# Patient Record
Sex: Male | Born: 1945 | Race: White | Hispanic: No | Marital: Married | Smoking: Former smoker
Health system: Southern US, Community
[De-identification: ages and names within clinical notes are randomized; demographics above are authoritative.]

## PROBLEM LIST (undated history)

## (undated) DIAGNOSIS — C801 Malignant (primary) neoplasm, unspecified: Secondary | ICD-10-CM

## (undated) DIAGNOSIS — K219 Gastro-esophageal reflux disease without esophagitis: Secondary | ICD-10-CM

## (undated) DIAGNOSIS — M1990 Unspecified osteoarthritis, unspecified site: Secondary | ICD-10-CM

## (undated) DIAGNOSIS — E785 Hyperlipidemia, unspecified: Secondary | ICD-10-CM

## (undated) DIAGNOSIS — T7840XA Allergy, unspecified, initial encounter: Secondary | ICD-10-CM

## (undated) DIAGNOSIS — I739 Peripheral vascular disease, unspecified: Secondary | ICD-10-CM

## (undated) DIAGNOSIS — I10 Essential (primary) hypertension: Secondary | ICD-10-CM

## (undated) HISTORY — DX: Hyperlipidemia, unspecified: E78.5

## (undated) HISTORY — DX: Peripheral vascular disease, unspecified: I73.9

## (undated) HISTORY — DX: Essential (primary) hypertension: I10

## (undated) HISTORY — DX: Allergy, unspecified, initial encounter: T78.40XA

---

## 1998-11-08 HISTORY — PX: HYDROCELE EXCISION: SHX482

## 2005-09-15 ENCOUNTER — Ambulatory Visit: Payer: Self-pay | Admitting: Gastroenterology

## 2008-11-13 ENCOUNTER — Ambulatory Visit: Payer: Self-pay | Admitting: Gastroenterology

## 2009-06-30 DIAGNOSIS — Z86018 Personal history of other benign neoplasm: Secondary | ICD-10-CM

## 2009-06-30 HISTORY — DX: Personal history of other benign neoplasm: Z86.018

## 2012-04-05 ENCOUNTER — Ambulatory Visit: Payer: Self-pay | Admitting: Gastroenterology

## 2013-01-09 ENCOUNTER — Ambulatory Visit: Payer: Self-pay | Admitting: Internal Medicine

## 2013-01-23 DIAGNOSIS — R310 Gross hematuria: Secondary | ICD-10-CM | POA: Insufficient documentation

## 2013-02-28 ENCOUNTER — Ambulatory Visit: Payer: Self-pay | Admitting: Unknown Physician Specialty

## 2013-03-06 HISTORY — PX: JOINT REPLACEMENT: SHX530

## 2014-03-10 DIAGNOSIS — Z96652 Presence of left artificial knee joint: Secondary | ICD-10-CM | POA: Insufficient documentation

## 2014-12-03 ENCOUNTER — Ambulatory Visit
Admission: RE | Admit: 2014-12-03 | Discharge: 2014-12-03 | Disposition: A | Discharge status: Home / Self Care | Payer: Medicare PPO | Source: Ambulatory Visit | Attending: Internal Medicine | Admitting: Internal Medicine

## 2014-12-03 ENCOUNTER — Other Ambulatory Visit: Payer: Self-pay | Admitting: Internal Medicine

## 2014-12-03 DIAGNOSIS — R600 Localized edema: Secondary | ICD-10-CM

## 2014-12-03 DIAGNOSIS — R590 Localized enlarged lymph nodes: Secondary | ICD-10-CM | POA: Insufficient documentation

## 2014-12-03 DIAGNOSIS — M7122 Synovial cyst of popliteal space [Baker], left knee: Secondary | ICD-10-CM | POA: Insufficient documentation

## 2015-01-14 DIAGNOSIS — M1711 Unilateral primary osteoarthritis, right knee: Secondary | ICD-10-CM | POA: Insufficient documentation

## 2015-02-04 DIAGNOSIS — C84A0 Cutaneous T-cell lymphoma, unspecified, unspecified site: Secondary | ICD-10-CM | POA: Insufficient documentation

## 2015-07-13 ENCOUNTER — Other Ambulatory Visit: Payer: Self-pay | Admitting: Unknown Physician Specialty

## 2015-07-13 DIAGNOSIS — Z96652 Presence of left artificial knee joint: Secondary | ICD-10-CM

## 2015-07-27 DIAGNOSIS — E790 Hyperuricemia without signs of inflammatory arthritis and tophaceous disease: Secondary | ICD-10-CM | POA: Insufficient documentation

## 2015-07-31 ENCOUNTER — Ambulatory Visit
Admission: RE | Admit: 2015-07-31 | Discharge: 2015-07-31 | Disposition: A | Discharge status: Home / Self Care | Payer: Medicare Other | Source: Ambulatory Visit | Attending: Unknown Physician Specialty | Admitting: Unknown Physician Specialty

## 2015-07-31 DIAGNOSIS — Z96659 Presence of unspecified artificial knee joint: Secondary | ICD-10-CM | POA: Diagnosis present

## 2015-07-31 DIAGNOSIS — M7122 Synovial cyst of popliteal space [Baker], left knee: Secondary | ICD-10-CM | POA: Diagnosis present

## 2015-07-31 DIAGNOSIS — Z96652 Presence of left artificial knee joint: Secondary | ICD-10-CM

## 2015-07-31 DIAGNOSIS — X58XXXA Exposure to other specified factors, initial encounter: Secondary | ICD-10-CM | POA: Diagnosis not present

## 2015-07-31 DIAGNOSIS — S83282A Other tear of lateral meniscus, current injury, left knee, initial encounter: Secondary | ICD-10-CM | POA: Diagnosis not present

## 2016-03-02 HISTORY — PX: JOINT REPLACEMENT: SHX530

## 2016-03-15 ENCOUNTER — Encounter (INDEPENDENT_AMBULATORY_CARE_PROVIDER_SITE_OTHER): Payer: Self-pay

## 2016-03-15 ENCOUNTER — Ambulatory Visit (INDEPENDENT_AMBULATORY_CARE_PROVIDER_SITE_OTHER): Payer: Self-pay | Admitting: Vascular Surgery

## 2016-04-18 ENCOUNTER — Other Ambulatory Visit (INDEPENDENT_AMBULATORY_CARE_PROVIDER_SITE_OTHER): Payer: Self-pay | Admitting: Vascular Surgery

## 2016-04-18 DIAGNOSIS — I6523 Occlusion and stenosis of bilateral carotid arteries: Secondary | ICD-10-CM

## 2016-04-19 ENCOUNTER — Ambulatory Visit (INDEPENDENT_AMBULATORY_CARE_PROVIDER_SITE_OTHER): Payer: Self-pay | Admitting: Vascular Surgery

## 2016-04-19 ENCOUNTER — Encounter (INDEPENDENT_AMBULATORY_CARE_PROVIDER_SITE_OTHER): Payer: Medicare Other

## 2016-06-07 ENCOUNTER — Ambulatory Visit (INDEPENDENT_AMBULATORY_CARE_PROVIDER_SITE_OTHER): Payer: Medicare Other | Admitting: Vascular Surgery

## 2016-06-07 ENCOUNTER — Ambulatory Visit (INDEPENDENT_AMBULATORY_CARE_PROVIDER_SITE_OTHER): Payer: Medicare Other

## 2016-06-07 ENCOUNTER — Encounter (INDEPENDENT_AMBULATORY_CARE_PROVIDER_SITE_OTHER): Payer: Self-pay | Admitting: Vascular Surgery

## 2016-06-07 VITALS — BP 162/96 | HR 82 | Resp 16 | Ht 70.0 in | Wt 262.0 lb

## 2016-06-07 DIAGNOSIS — I6523 Occlusion and stenosis of bilateral carotid arteries: Secondary | ICD-10-CM

## 2016-06-07 DIAGNOSIS — I6521 Occlusion and stenosis of right carotid artery: Secondary | ICD-10-CM

## 2016-06-07 DIAGNOSIS — E785 Hyperlipidemia, unspecified: Secondary | ICD-10-CM

## 2016-06-07 DIAGNOSIS — I10 Essential (primary) hypertension: Secondary | ICD-10-CM | POA: Diagnosis not present

## 2016-06-07 DIAGNOSIS — I6529 Occlusion and stenosis of unspecified carotid artery: Secondary | ICD-10-CM | POA: Insufficient documentation

## 2016-06-07 LAB — VAS US CAROTID
LEFT ECA DIAS: -13 cm/s
LICADDIAS: -15 cm/s
LICAPDIAS: 8 cm/s
Left CCA dist dias: -15 cm/s
Left CCA dist sys: -70 cm/s
Left CCA prox dias: 11 cm/s
Left CCA prox sys: 91 cm/s
Left ICA dist sys: -57 cm/s
Left ICA prox sys: 47 cm/s
RCCAPDIAS: 16 cm/s
RIGHT CCA MID DIAS: 12 cm/s
RIGHT ECA DIAS: -12 cm/s
Right CCA prox sys: 88 cm/s
Right cca dist sys: -67 cm/s

## 2016-06-07 NOTE — Assessment & Plan Note (Signed)
His carotid duplex today reveals stable 1-39% right ICA stenosis and no significant left carotid artery stenosis is present. Continue current medical regimen. Recheck in 1-2 years.

## 2016-06-07 NOTE — Assessment & Plan Note (Signed)
lipid control important in reducing the progression of atherosclerotic disease. Continue statin therapy  

## 2016-06-07 NOTE — Progress Notes (Signed)
MRN : [id number]  John Peterson is a 71 y.o. (1945-11-27) male who presents with chief complaint of  Chief Complaint  Patient presents with  . Carotid    1 year carotid  .  History of Present Illness: Patient returns in follow-up of his carotid disease. He continues to take a statin agent and is also on aspirin. He reports his only major issue since his last visit have been orthopedic issues. He is recovering from a left knee replacement and needs a right knee replacement. He denies focal neurologic symptoms. Specifically, the patient denies amaurosis fugax, speech or swallowing difficulties, or arm or leg weakness or numbness His carotid duplex today reveals stable 1-39% right ICA stenosis and no significant left carotid artery stenosis is present.  Current Outpatient Prescriptions  Medication Sig Dispense Refill  . allopurinol (ZYLOPRIM) 100 MG tablet Take 100 mg by mouth daily.  3  . amLODipine (NORVASC) 10 MG tablet TAKE 1 TABLET BY MOUTH ONCE A DAY    . aspirin EC 81 MG tablet Take by mouth.    Marland Kitchen atorvastatin (LIPITOR) 10 MG tablet Take by mouth.    . celecoxib (CELEBREX) 200 MG capsule TAKE 1 CAPSULE (200 MG TOTAL) BY MOUTH ONCE DAILY.  2  . desonide (DESOWEN) 0.05 % cream APPLY ON THE SKIN DAILY AS NEEDED FLARES  12  . ELIQUIS 2.5 MG TABS tablet TAKE 1 TABLET BY MOUTH TWICE A DAY FOR 2 WEEKS  0  . famotidine (PEPCID) 20 MG tablet Take by mouth.    Marland Kitchen lisinopril (PRINIVIL,ZESTRIL) 40 MG tablet Take 40 mg by mouth daily.  1  . Loratadine 10 MG CAPS Take by mouth.    . Multiple Vitamins-Minerals (MULTIVITAMIN WITH MINERALS) tablet Take by mouth.    . oxyCODONE (OXY IR/ROXICODONE) 5 MG immediate release tablet TAKE 1 TABLET BY MOUTH EVERY 4 TO 6 HOURS AS NEEDED FOR PAIN  0  . rivaroxaban (XARELTO) 10 MG TABS tablet     . traMADol (ULTRAM) 50 MG tablet Take by mouth.     No current facility-administered medications for this visit.     Past Medical History:  Diagnosis Date  .  Allergy   . Hyperlipidemia   . Hypertension   . Peripheral vascular disease ([place])     No past surgical history on file.  Social History Social History  Substance Use Topics  . Smoking status: Former Smoker    Quit date: 1968  . Smokeless tobacco: Never Used  . Alcohol use Yes     Family History No bleeding or clotting disorders  Allergies  Allergen Reactions  . Tramadol Swelling    Pt concerned if this med making his glands in neck swell. But has taken since then with no adverse reactions.     REVIEW OF SYSTEMS (Negative unless checked)  Constitutional: [] Weight loss  [] Fever  [] Chills Cardiac: [] Chest pain   [] Chest pressure   [] Palpitations   [] Shortness of breath when laying flat   [] Shortness of breath at rest   [] Shortness of breath with exertion. Vascular:  [] Pain in legs with walking   [] Pain in legs at rest   [] Pain in legs when laying flat   [] Claudication   [] Pain in feet when walking  [] Pain in feet at rest  [] Pain in feet when laying flat   [] History of DVT   [] Phlebitis   [x] Swelling in legs   [] Varicose veins   [] Non-healing ulcers Pulmonary:   [] Uses home oxygen   []   Productive cough   [] Hemoptysis   [] Wheeze  [] COPD   [] Asthma Neurologic:  [] Dizziness  [] Blackouts   [] Seizures   [] History of stroke   [] History of TIA  [] Aphasia   [] Temporary blindness   [] Dysphagia   [] Weakness or numbness in arms   [] Weakness or numbness in legs Musculoskeletal:  [x] Arthritis   [] Joint swelling   [x] Joint pain   [] Low back pain Hematologic:  [] Easy bruising  [] Easy bleeding   [] Hypercoagulable state   [] Anemic  [] Hepatitis Gastrointestinal:  [] Blood in stool   [] Vomiting blood  [] Gastroesophageal reflux/heartburn   [] Difficulty swallowing. Genitourinary:  [] Chronic kidney disease   [] Difficult urination  [] Frequent urination  [] Burning with urination   [] Blood in urine Skin:  [] Rashes   [] Ulcers   [] Wounds Psychological:  [] History of anxiety   []  History of major  depression.  Physical Examination  Vitals:   06/07/16 0827 06/07/16 0828  BP: (!) 175/96 (!) 162/96  Pulse: 82   Resp: 16   Weight: 118.8 kg (262 lb)   Height: 5\' 10"  (1.778 m)    Body mass index is 37.59 kg/m. Gen:  WD/WN, NAD. Appears younger than stated age Head: [place]/AT, No temporalis wasting. Ear/Nose/Throat: Hearing grossly intact, nares w/o erythema or drainage, trachea midline Eyes: Conjunctiva clear. Sclera non-icteric Neck: Supple.  No bruit or JVD.  Pulmonary:  Good air movement, equal and clear to auscultation bilaterally.  Cardiac: RRR, normal S1, S2, no Murmurs, rubs or gallops. Vascular:  Vessel Right Left  Radial Palpable Palpable                                   Gastrointestinal: soft, non-tender/non-distended.  Musculoskeletal: M/S 5/5 throughout.  No deformity or atrophy.  Neurologic: CN 2-12 intact. Sensation grossly intact in extremities.  Symmetrical.  Speech is fluent. Motor exam as listed above. Psychiatric: Judgment intact, Mood & affect appropriate for pt's clinical situation. Dermatologic: No rashes or ulcers noted.  No cellulitis or open wounds.     CBC No results found for: WBC, HGB, HCT, MCV, PLT  BMET No results found for: NA, K, CL, CO2, GLUCOSE, BUN, CREATININE, CALCIUM, GFRNONAA, GFRAA CrCl cannot be calculated (No order found.).  COAG No results found for: INR, PROTIME  Radiology No results found.   Assessment/Plan Essential hypertension, benign blood pressure control important in reducing the progression of atherosclerotic disease. On appropriate oral medications.   Hyperlipidemia lipid control important in reducing the progression of atherosclerotic disease. Continue statin therapy   Carotid stenosis His carotid duplex today reveals stable 1-39% right ICA stenosis and no significant left carotid artery stenosis is present. Continue current medical regimen. Recheck in 1-2 years.    Leotis Pain, MD  06/07/2016 9:02  AM    This note was created with Dragon medical transcription system.  Any errors from dictation are purely unintentional

## 2016-06-07 NOTE — Assessment & Plan Note (Signed)
blood pressure control important in reducing the progression of atherosclerotic disease. On appropriate oral medications.  

## 2016-11-22 DIAGNOSIS — E669 Obesity, unspecified: Secondary | ICD-10-CM | POA: Insufficient documentation

## 2017-06-09 ENCOUNTER — Ambulatory Visit (INDEPENDENT_AMBULATORY_CARE_PROVIDER_SITE_OTHER): Payer: Medicare Other

## 2017-06-09 ENCOUNTER — Encounter (INDEPENDENT_AMBULATORY_CARE_PROVIDER_SITE_OTHER): Payer: Self-pay | Admitting: Vascular Surgery

## 2017-06-09 ENCOUNTER — Ambulatory Visit (INDEPENDENT_AMBULATORY_CARE_PROVIDER_SITE_OTHER): Payer: Medicare Other | Admitting: Vascular Surgery

## 2017-06-09 VITALS — BP 146/90 | HR 90 | Resp 17 | Ht 69.0 in | Wt 257.0 lb

## 2017-06-09 DIAGNOSIS — E785 Hyperlipidemia, unspecified: Secondary | ICD-10-CM

## 2017-06-09 DIAGNOSIS — I10 Essential (primary) hypertension: Secondary | ICD-10-CM | POA: Diagnosis not present

## 2017-06-09 DIAGNOSIS — I6521 Occlusion and stenosis of right carotid artery: Secondary | ICD-10-CM

## 2017-06-09 NOTE — Progress Notes (Signed)
MRN : [id number]  John Peterson is a 72 y.o. ([birth year]) male who presents with chief complaint of  Chief Complaint  Patient presents with  . Follow-up    ultrasound  .  History of Present Illness: Patient returns in follow-up of his carotid disease.  He is doing well today and denies any major symptoms or problems.  He has no focal neurologic issues. Specifically, the patient denies amaurosis fugax, speech or swallowing difficulties, or arm or leg weakness or numbness.  Carotid duplex today reveals stable, 1 to 39% right ICA stenosis and no significant left carotid artery stenosis is present.  Current Outpatient Medications  Medication Sig Dispense Refill  . allopurinol (ZYLOPRIM) 100 MG tablet Take 100 mg by mouth daily.  3  . amLODipine (NORVASC) 10 MG tablet TAKE 1 TABLET BY MOUTH ONCE A DAY    . aspirin EC 81 MG tablet Take by mouth.    Marland Kitchen atorvastatin (LIPITOR) 10 MG tablet Take by mouth.    . celecoxib (CELEBREX) 200 MG capsule TAKE 1 CAPSULE (200 MG TOTAL) BY MOUTH ONCE DAILY.  2  . cyclobenzaprine (FLEXERIL) 10 MG tablet Take 10 mg by mouth 3 (three) times daily as needed for muscle spasms.    Marland Kitchen desonide (DESOWEN) 0.05 % cream APPLY ON THE SKIN DAILY AS NEEDED FLARES  12  . famotidine (PEPCID) 20 MG tablet Take by mouth.    Marland Kitchen ketoconazole (NIZORAL) 2 % cream Apply 1 application topically daily.    Marland Kitchen lisinopril (PRINIVIL,ZESTRIL) 40 MG tablet Take 40 mg by mouth daily.  1  . Loratadine 10 MG CAPS Take by mouth.    . Multiple Vitamins-Minerals (MULTIVITAMIN WITH MINERALS) tablet Take by mouth.    . tamsulosin (FLOMAX) 0.4 MG CAPS capsule TAKE 1 CAPSULE (0.4 MG TOTAL) BY MOUTH ONCE DAILY TAKE 30 MINUTES AFTER SAME MEAL EACH DAY.  11  . ELIQUIS 2.5 MG TABS tablet TAKE 1 TABLET BY MOUTH TWICE A DAY FOR 2 WEEKS  0  . oxyCODONE (OXY IR/ROXICODONE) 5 MG immediate release tablet TAKE 1 TABLET BY MOUTH EVERY 4 TO 6 HOURS AS NEEDED FOR PAIN  0  . rivaroxaban (XARELTO) 10 MG TABS  tablet     . traMADol (ULTRAM) 50 MG tablet Take by mouth.     No current facility-administered medications for this visit.     Past Medical History:  Diagnosis Date  . Allergy   . Hyperlipidemia   . Hypertension   . Peripheral vascular disease [facility])    Past Surgical History Knee replacement   Social History       Social History  Substance Use Topics  . Smoking status: Former Smoker    Quit date: 1968  . Smokeless tobacco: Never Used  . Alcohol use Yes      Family History No bleeding or clotting disorders       Allergies  Allergen Reactions  . Tramadol Swelling    Pt concerned if this med making his glands in neck swell. But has taken since then with no adverse reactions.     REVIEW OF SYSTEMS (Negative unless checked)  Constitutional: [] Weight loss  [] Fever  [] Chills Cardiac: [] Chest pain   [] Chest pressure   [] Palpitations   [] Shortness of breath when laying flat   [] Shortness of breath at rest   [] Shortness of breath with exertion. Vascular:  [] Pain in legs with walking   [] Pain in legs at rest   [] Pain in legs when laying flat   []   Claudication   [] Pain in feet when walking  [] Pain in feet at rest  [] Pain in feet when laying flat   [] History of DVT   [] Phlebitis   [x] Swelling in legs   [] Varicose veins   [] Non-healing ulcers Pulmonary:   [] Uses home oxygen   [] Productive cough   [] Hemoptysis   [] Wheeze  [] COPD   [] Asthma Neurologic:  [] Dizziness  [] Blackouts   [] Seizures   [] History of stroke   [] History of TIA  [] Aphasia   [] Temporary blindness   [] Dysphagia   [] Weakness or numbness in arms   [] Weakness or numbness in legs Musculoskeletal:  [x] Arthritis   [] Joint swelling   [x] Joint pain   [] Low back pain Hematologic:  [] Easy bruising  [] Easy bleeding   [] Hypercoagulable state   [] Anemic  [] Hepatitis Gastrointestinal:  [] Blood in stool   [] Vomiting blood  [] Gastroesophageal reflux/heartburn   [] Difficulty swallowing. Genitourinary:  [] Chronic kidney  disease   [] Difficult urination  [] Frequent urination  [] Burning with urination   [] Blood in urine Skin:  [] Rashes   [] Ulcers   [] Wounds Psychological:  [] History of anxiety   []  History of major depression.      Physical Examination  Vitals:   06/09/17 0914 06/09/17 0915  BP: 136/84 (!) 146/90  Pulse: 90   Resp: 17   Weight: 116.6 kg (257 lb)   Height: 5\' 9"  (1.753 m)    Body mass index is 37.95 kg/m. Gen:  WD/WN, NAD.  Appears younger than stated age Head: [place]/AT, No temporalis wasting. Ear/Nose/Throat: Hearing grossly intact, nares w/o erythema or drainage, trachea midline Eyes: Conjunctiva clear. Sclera non-icteric Neck: Supple.  No bruit  Pulmonary:  Good air movement, equal and clear to auscultation bilaterally.  Cardiac: RRR, No JVD Vascular:  Vessel Right Left  Radial Palpable Palpable                   Musculoskeletal: M/S 5/5 throughout.  No deformity or atrophy.  Neurologic: CN 2-12 intact. Sensation grossly intact in extremities.  Symmetrical.  Speech is fluent. Motor exam as listed above. Psychiatric: Judgment intact, Mood & affect appropriate for pt's clinical situation. Dermatologic: No rashes or ulcers noted.  No cellulitis or open wounds.      CBC No results found for: WBC, HGB, HCT, MCV, PLT  BMET No results found for: NA, K, CL, CO2, GLUCOSE, BUN, CREATININE, CALCIUM, GFRNONAA, GFRAA CrCl cannot be calculated (No order found.).  COAG No results found for: INR, PROTIME  Radiology No results found.  Assessment/Plan Essential hypertension, benign blood pressure control important in reducing the progression of atherosclerotic disease. On appropriate oral medications.   Hyperlipidemia lipid control important in reducing the progression of atherosclerotic disease. Continue statin therapy   Carotid stenosis Carotid duplex today reveals stable, 1 to 39% right ICA stenosis and no significant left carotid artery stenosis is present. He  continues on aspirin and Lipitor.  No role for intervention at such mild disease.  Recheck in 2 years.    Leotis Pain, MD  06/09/2017 9:59 AM    This note was created with Dragon medical transcription system.  Any errors from dictation are purely unintentional

## 2017-06-09 NOTE — Assessment & Plan Note (Signed)
Carotid duplex today reveals stable, 1 to 39% right ICA stenosis and no significant left carotid artery stenosis is present. He continues on aspirin and Lipitor.  No role for intervention at such mild disease.  Recheck in 2 years.

## 2017-07-08 HISTORY — PX: MOLE REMOVAL: SHX2046

## 2018-12-21 ENCOUNTER — Other Ambulatory Visit: Payer: Self-pay | Admitting: Surgery

## 2019-01-14 ENCOUNTER — Encounter
Admission: RE | Admit: 2019-01-14 | Discharge: 2019-01-14 | Disposition: A | Discharge status: Home / Self Care | Payer: Medicare Other | Source: Ambulatory Visit | Attending: Surgery | Admitting: Surgery

## 2019-01-14 ENCOUNTER — Other Ambulatory Visit: Payer: Self-pay

## 2019-01-14 DIAGNOSIS — Z01818 Encounter for other preprocedural examination: Secondary | ICD-10-CM | POA: Diagnosis not present

## 2019-01-14 DIAGNOSIS — R9431 Abnormal electrocardiogram [ECG] [EKG]: Secondary | ICD-10-CM | POA: Insufficient documentation

## 2019-01-14 DIAGNOSIS — I4510 Unspecified right bundle-branch block: Secondary | ICD-10-CM | POA: Insufficient documentation

## 2019-01-14 HISTORY — DX: Gastro-esophageal reflux disease without esophagitis: K21.9

## 2019-01-14 HISTORY — DX: Unspecified osteoarthritis, unspecified site: M19.90

## 2019-01-14 HISTORY — DX: Malignant (primary) neoplasm, unspecified: C80.1

## 2019-01-14 LAB — CBC WITH DIFFERENTIAL/PLATELET
Abs Immature Granulocytes: 0.01 10*3/uL (ref 0.00–0.07)
Basophils Absolute: 0.1 10*3/uL (ref 0.0–0.1)
Basophils Relative: 1 %
Eosinophils Absolute: 0.2 10*3/uL (ref 0.0–0.5)
Eosinophils Relative: 4 %
HCT: 44.1 % (ref 39.0–52.0)
Hemoglobin: 14.8 g/dL (ref 13.0–17.0)
Immature Granulocytes: 0 %
Lymphocytes Relative: 28 %
Lymphs Abs: 1.8 10*3/uL (ref 0.7–4.0)
MCH: 31.2 pg (ref 26.0–34.0)
MCHC: 33.6 g/dL (ref 30.0–36.0)
MCV: 93 fL (ref 80.0–100.0)
Monocytes Absolute: 0.6 10*3/uL (ref 0.1–1.0)
Monocytes Relative: 9 %
Neutro Abs: 3.6 10*3/uL (ref 1.7–7.7)
Neutrophils Relative %: 58 %
Platelets: 215 10*3/uL (ref 150–400)
RBC: 4.74 MIL/uL (ref 4.22–5.81)
RDW: 12.7 % (ref 11.5–15.5)
WBC: 6.3 10*3/uL (ref 4.0–10.5)
nRBC: 0 % (ref 0.0–0.2)

## 2019-01-14 LAB — SURGICAL PCR SCREEN
MRSA, PCR: NEGATIVE
Staphylococcus aureus: NEGATIVE

## 2019-01-14 LAB — COMPREHENSIVE METABOLIC PANEL
ALT: 15 U/L (ref 0–44)
AST: 18 U/L (ref 15–41)
Albumin: 4.2 g/dL (ref 3.5–5.0)
Alkaline Phosphatase: 67 U/L (ref 38–126)
Anion gap: 8 (ref 5–15)
BUN: 17 mg/dL (ref 8–23)
CO2: 26 mmol/L (ref 22–32)
Calcium: 9.4 mg/dL (ref 8.9–10.3)
Chloride: 104 mmol/L (ref 98–111)
Creatinine, Ser: 0.81 mg/dL (ref 0.61–1.24)
GFR calc Af Amer: >60 mL/min (ref >60)
GFR calc non Af Amer: >60 mL/min (ref >60)
Glucose, Bld: 93 mg/dL (ref 70–99)
Potassium: 4.1 mmol/L (ref 3.5–5.1)
Sodium: 138 mmol/L (ref 135–145)
Total Bilirubin: 0.6 mg/dL (ref 0.3–1.2)
Total Protein: 7.3 g/dL (ref 6.5–8.1)

## 2019-01-14 LAB — URINALYSIS, ROUTINE W REFLEX MICROSCOPIC
Bilirubin Urine: NEGATIVE
Glucose, UA: NEGATIVE mg/dL
Hgb urine dipstick: NEGATIVE
Ketones, ur: NEGATIVE mg/dL
Leukocytes,Ua: NEGATIVE
Nitrite: NEGATIVE
Protein, ur: NEGATIVE mg/dL
Specific Gravity, Urine: 1.011 (ref 1.005–1.030)
pH: 7 (ref 5.0–8.0)

## 2019-01-14 LAB — TYPE AND SCREEN
ABO/RH(D): O NEG
Antibody Screen: NEGATIVE

## 2019-01-14 NOTE — Patient Instructions (Signed)
Your COVID swab is Friday 01/18/2019 any time 8:00-10:30 am. Drive up in front of the [facility] and remain in your vehicle.  Your procedure is scheduled on: Tuesday 01/22/2019 Report to Same Day Surgery 2nd floor [facility] Entrance-take elevator on left to 2nd floor.  Check in with surgery information desk.) To find out your arrival time, call [phone] 1:00-3:00 PM on Monday 01/21/2019  Remember: Instructions that are not followed completely may result in serious medical risk, up to and including death, or upon the discretion of your surgeon and anesthesiologist your surgery may need to be rescheduled.    __x__ 1. Do not eat food (including mints, candies, chewing gum) after midnight the night before your procedure. You may drink clear liquids up to 2 hours before you are scheduled to arrive at the hospital for your procedure.  Do not drink anything within 2 hours of your scheduled arrival to the hospital.  Approved clear liquids:  --Water or Apple juice without pulp  --Clear carbohydrate beverage such as Gatorade or Powerade  --Black Coffee or Clear Tea (No milk, no creamers, do not add anything to the coffee or tea)  Finish your provided Ensure drink 2 hours before your scheduled arrival time.    __x__ 2. No Alcohol for 24 hours before or after surgery.   __x__ 3. No Smoking or e-cigarettes for 24 hours before surgery.  Do not use any chewable tobacco products for at least 6 hours before surgery.   __x__ 4. Notify your doctor if there is any change in your medical condition (cold, fever, infections).   __x__ 5. On the morning of surgery brush your teeth with toothpaste and water.  You may rinse your mouth with mouthwash if you wish.  Do not swallow any toothpaste or mouthwash.  Please read over the following fact sheets that you were given:   [facility] Preparing for Surgery and/or MRSA Information    __x__ Use CHG Soap as directed on instruction  sheet.   Do not wear jewelry, lotions, deodorant, or perfumes on the day of surgery.  Do not shave below the face/neck 48 hours prior to surgery.   Do not bring valuables to the hospital.    [facility] is not responsible for any belongings or valuables.               Contacts, dentures or bridgework may not be worn into surgery.  For patients admitted to the hospital, discharge time is determined by your treatment team.  For patients discharged on the day of surgery, you will NOT be permitted to drive yourself home.  You must have a responsible adult with you for 24 hours after surgery.  __x__ Take these medicines on the morning of surgery with a SMALL SIP OF WATER:  1. Allopurinol (Zyloprim)  2. Amlodipine (Norvasc)  3. Atorvastatin (Lipitor)  4. Celecoxib (Celebrex)  5. Loratadine (Claritin)   6. Tylenol if needed for pain  Skip your Lisinopril (Prinivil) only on the morning of surgery.  No need to stop this ahead of time.  __x__ Follow recommendations from Cardiologist, Pulmonologist or PCP regarding stopping any blood thinners such as Aspirin, Coumadin, Plavix, Eliquis, Effient, Pradaxa, and Pletal.  __x__ STARTING TODAY: Stop Anti-inflammatories such as aspirin, Advil, Ibuprofen, Motrin, Aleve, Naproxen, Naprosyn, BC/Goodies powders. You may continue to take Tylenol and Celebrex.   __x__ STARTING TODAY: Stop over the counter supplements until after surgery. You may continue to take your multivitamin.  If possible, please bring a copy of any medical advance directives.

## 2019-01-18 ENCOUNTER — Other Ambulatory Visit: Payer: Medicare Other

## 2019-01-22 ENCOUNTER — Inpatient Hospital Stay: Admission: RE | Admit: 2019-01-22 | Payer: Medicare Other | Source: Home / Self Care | Admitting: Surgery

## 2019-01-22 ENCOUNTER — Encounter: Admission: RE | Payer: Self-pay | Source: Home / Self Care

## 2019-01-22 SURGERY — ARTHROPLASTY, KNEE, TOTAL
Anesthesia: Choice | Site: Knee | Laterality: Right

## 2019-02-15 DIAGNOSIS — I739 Peripheral vascular disease, unspecified: Secondary | ICD-10-CM | POA: Insufficient documentation

## 2019-03-20 ENCOUNTER — Other Ambulatory Visit: Payer: Self-pay | Admitting: Surgery

## 2019-03-26 ENCOUNTER — Encounter
Admission: RE | Admit: 2019-03-26 | Discharge: 2019-03-26 | Disposition: A | Discharge status: Home / Self Care | Payer: Medicare PPO | Source: Ambulatory Visit | Attending: Surgery | Admitting: Surgery

## 2019-03-26 ENCOUNTER — Other Ambulatory Visit: Payer: Self-pay

## 2019-03-26 NOTE — Patient Instructions (Addendum)
Your COVID test is scheduled on: Friday 03/29/19 any time 8:00-10:30 am.  Enter through the [facility] for lab work inside, then drive up in front of [facility] for your COVID test and stay in your vehicle. Your procedure is scheduled on: Tuesday 04/02/19 Report to Same Day Surgery 2nd floor [facility] Entrance-take elevator on left to 2nd floor.  Check in with surgery information desk.) To find out your arrival time, call [phone] 1:00-3:00 PM on Monday 04/01/19  Remember: Instructions that are not followed completely may result in serious medical risk, up to and including death, or upon the discretion of your surgeon and anesthesiologist your surgery may need to be rescheduled.   __x__ 1. Do not eat food (including mints, candies, chewing gum) after midnight the night before your procedure. You may drink clear liquids up to 2 hours before you are scheduled to arrive at the hospital for your procedure.  Do not drink anything within 2 hours of your scheduled arrival to the hospital.  Approved clear liquids:  --Water or Apple juice without pulp  --Clear carbohydrate beverage such as Gatorade or Powerade  --Black Coffee or Clear Tea (No milk, no creamers, do not add anything to the coffee or tea)  Finish your provided Ensure drink 2 hours before your scheduled arrival time.  __x__ 2. No Alcohol or smoking for 24 hours before or after surgery.  __x__ 3. Notify your doctor if there is any change in your medical condition (cold, fever, infections).  __x__ 4. On the morning of surgery brush your teeth with toothpaste and water.  You may rinse your mouth with mouthwash if you wish.  Do not swallow any toothpaste or mouthwash.  Please read over the following fact sheets that you were given: [facility] Preparing for Surgery and/or MRSA Information, Guide to Joint Replacement, How to Use an Incentive Spirometer   __x__ Use CHG Soap provided as directed on instruction  sheet (attached)  Do not wear jewelry, lotions, powders, deodorant, or perfumes on the day of surgery. Do not shave below the face/neck 48 hours prior to surgery.  Do not bring valuables to the hospital.  [facility] is not responsible for any belongings or valuables.   Contacts, dentures or bridgework may not be worn into surgery. For patients admitted to the hospital, discharge time is determined by your treatment team. For patients discharged on the day of surgery, you will NOT be permitted to drive yourself home.  You must have a responsible adult with you for 24 hours after surgery.  __x__ Take these medicines on the morning of surgery with a  SMALL SIP OF WATER:  1. Allopurinol (Zyloprim)  2. Amlodipine (Norvasc)  3. Atorvastatin (Lipitor)  4. Celecoxib (Celebrex)  5. Loratadine (Claritin)  6. Tylenol if needed for pain  Skip your Lisinopril (Prinivil) only on the morning of surgery.  No need to stop this ahead of time.  __x__ Follow recommendations from Cardiologist, Pulmonologist or PCP regarding stopping Aspirin, Coumadin, Plavix, Eliquis, Effient, Pradaxa, and Pletal.  __x__ STARTING TODAY: Stop Anti-inflammatories such as aspirin, Advil, Ibuprofen, Motrin, Aleve, Naproxen, Naprosyn, BC/Goodies powders. You may continue to take Tylenol and Celebrex.   __x__ STARTING TODAY: Stop over the counter supplements until after surgery. You may continue to take your multivitamin.  __x__ If possible, please bring a copy of any medical advance directives so they can be scanned into your electronic medical record.  However, this is NOT required for surgery.

## 2019-03-29 ENCOUNTER — Other Ambulatory Visit: Payer: Self-pay

## 2019-03-29 ENCOUNTER — Encounter
Admission: RE | Admit: 2019-03-29 | Discharge: 2019-03-29 | Disposition: A | Discharge status: Home / Self Care | Payer: Medicare PPO | Source: Ambulatory Visit | Attending: Surgery | Admitting: Surgery

## 2019-03-29 ENCOUNTER — Other Ambulatory Visit: Payer: Medicare PPO

## 2019-03-29 DIAGNOSIS — Z20822 Contact with and (suspected) exposure to covid-19: Secondary | ICD-10-CM | POA: Diagnosis not present

## 2019-03-29 DIAGNOSIS — Z01812 Encounter for preprocedural laboratory examination: Secondary | ICD-10-CM | POA: Insufficient documentation

## 2019-03-29 LAB — URINALYSIS, ROUTINE W REFLEX MICROSCOPIC
Bilirubin Urine: NEGATIVE
Glucose, UA: NEGATIVE mg/dL
Hgb urine dipstick: NEGATIVE
Ketones, ur: NEGATIVE mg/dL
Leukocytes,Ua: NEGATIVE
Nitrite: NEGATIVE
Protein, ur: NEGATIVE mg/dL
Specific Gravity, Urine: 1.013 (ref 1.005–1.030)
pH: 5 (ref 5.0–8.0)

## 2019-03-29 LAB — TYPE AND SCREEN
ABO/RH(D): O NEG
Antibody Screen: NEGATIVE

## 2019-03-29 LAB — SARS CORONAVIRUS 2 (TAT 6-24 HRS): SARS Coronavirus 2: NEGATIVE

## 2019-03-29 LAB — SURGICAL PCR SCREEN
MRSA, PCR: NEGATIVE
Staphylococcus aureus: NEGATIVE

## 2019-04-01 MED ORDER — CEFAZOLIN SODIUM-DEXTROSE 2-4 GM/100ML-% IV SOLN
2.0000 g | INTRAVENOUS | Status: AC
  Administered 2019-04-02: 2 g via INTRAVENOUS

## 2019-04-02 ENCOUNTER — Other Ambulatory Visit: Payer: Self-pay

## 2019-04-02 ENCOUNTER — Inpatient Hospital Stay
Admission: RE | Admit: 2019-04-02 | Discharge: 2019-04-04 | DRG: 470 | Disposition: A | Discharge status: Home Health | Payer: Medicare PPO | Attending: Surgery | Admitting: Surgery

## 2019-04-02 ENCOUNTER — Inpatient Hospital Stay: Payer: Medicare PPO

## 2019-04-02 ENCOUNTER — Encounter: Payer: Self-pay | Admitting: Surgery

## 2019-04-02 ENCOUNTER — Encounter
Admission: RE | Disposition: A | Discharge status: Home Health | Payer: Self-pay | Source: Home / Self Care | Attending: Surgery

## 2019-04-02 DIAGNOSIS — M1711 Unilateral primary osteoarthritis, right knee: Principal | ICD-10-CM | POA: Diagnosis present

## 2019-04-02 DIAGNOSIS — I739 Peripheral vascular disease, unspecified: Secondary | ICD-10-CM | POA: Diagnosis present

## 2019-04-02 DIAGNOSIS — Z87891 Personal history of nicotine dependence: Secondary | ICD-10-CM | POA: Diagnosis not present

## 2019-04-02 DIAGNOSIS — E785 Hyperlipidemia, unspecified: Secondary | ICD-10-CM | POA: Diagnosis present

## 2019-04-02 DIAGNOSIS — K219 Gastro-esophageal reflux disease without esophagitis: Secondary | ICD-10-CM | POA: Diagnosis present

## 2019-04-02 DIAGNOSIS — Z96651 Presence of right artificial knee joint: Secondary | ICD-10-CM

## 2019-04-02 DIAGNOSIS — Z79899 Other long term (current) drug therapy: Secondary | ICD-10-CM

## 2019-04-02 DIAGNOSIS — I10 Essential (primary) hypertension: Secondary | ICD-10-CM | POA: Diagnosis present

## 2019-04-02 DIAGNOSIS — Z23 Encounter for immunization: Secondary | ICD-10-CM | POA: Diagnosis not present

## 2019-04-02 HISTORY — PX: TOTAL KNEE ARTHROPLASTY: SHX125

## 2019-04-02 SURGERY — ARTHROPLASTY, KNEE, TOTAL
Anesthesia: Spinal | Site: Knee | Laterality: Right

## 2019-04-02 MED ORDER — KETOCONAZOLE 2 % EX CREA
1.0000 "application " | TOPICAL_CREAM | Freq: Every day | CUTANEOUS | Status: DC
  Filled 2019-04-02: qty 15

## 2019-04-02 MED ORDER — ENOXAPARIN SODIUM 40 MG/0.4ML ~~LOC~~ SOLN
40.0000 mg | SUBCUTANEOUS | Status: DC
  Filled 2019-04-02: qty 0.4

## 2019-04-02 MED ORDER — ATORVASTATIN CALCIUM 10 MG PO TABS
10.0000 mg | ORAL_TABLET | Freq: Every day | ORAL | Status: DC
  Administered 2019-04-03 – 2019-04-04 (×2): 10 mg via ORAL
  Filled 2019-04-02 (×2): qty 1

## 2019-04-02 MED ORDER — LISINOPRIL 20 MG PO TABS
40.0000 mg | ORAL_TABLET | Freq: Every day | ORAL | Status: DC
  Administered 2019-04-02 – 2019-04-04 (×3): 40 mg via ORAL
  Filled 2019-04-02 (×3): qty 2

## 2019-04-02 MED ORDER — TRANEXAMIC ACID 1000 MG/10ML IV SOLN
INTRAVENOUS | Status: AC
  Filled 2019-04-02: qty 10

## 2019-04-02 MED ORDER — OXYCODONE HCL 5 MG PO TABS
5.0000 mg | ORAL_TABLET | ORAL | Status: DC | PRN
  Administered 2019-04-02: 10 mg via ORAL
  Administered 2019-04-02 (×3): 5 mg via ORAL
  Administered 2019-04-03 (×2): 10 mg via ORAL
  Administered 2019-04-03 – 2019-04-04 (×2): 5 mg via ORAL
  Filled 2019-04-02: qty 2
  Filled 2019-04-02 (×2): qty 1
  Filled 2019-04-02: qty 2
  Filled 2019-04-02 (×2): qty 1
  Filled 2019-04-02 (×2): qty 2

## 2019-04-02 MED ORDER — BUPIVACAINE-EPINEPHRINE (PF) 0.5% -1:200000 IJ SOLN
INTRAMUSCULAR | Status: AC
  Filled 2019-04-02: qty 30

## 2019-04-02 MED ORDER — MIDAZOLAM HCL 2 MG/2ML IJ SOLN
INTRAMUSCULAR | Status: AC
  Filled 2019-04-02: qty 2

## 2019-04-02 MED ORDER — FLUTICASONE PROPIONATE 50 MCG/ACT NA SUSP
1.0000 | Freq: Every day | NASAL | Status: DC
  Administered 2019-04-04: 1 via NASAL
  Filled 2019-04-02: qty 16

## 2019-04-02 MED ORDER — FAMOTIDINE 20 MG PO TABS
ORAL_TABLET | ORAL | Status: AC
  Administered 2019-04-02: 20 mg via ORAL
  Filled 2019-04-02: qty 1

## 2019-04-02 MED ORDER — GLYCOPYRROLATE 0.2 MG/ML IJ SOLN
INTRAMUSCULAR | Status: AC
  Filled 2019-04-02: qty 1

## 2019-04-02 MED ORDER — PNEUMOCOCCAL VAC POLYVALENT 25 MCG/0.5ML IJ INJ
0.5000 mL | INJECTION | INTRAMUSCULAR | Status: AC
  Administered 2019-04-03: 0.5 mL via INTRAMUSCULAR
  Filled 2019-04-02: qty 0.5

## 2019-04-02 MED ORDER — PROPOFOL 10 MG/ML IV BOLUS
INTRAVENOUS | Status: AC
  Filled 2019-04-02: qty 20

## 2019-04-02 MED ORDER — CA CARBONATE-MAG HYDROXIDE 550-110 MG PO CHEW: CHEWABLE_TABLET | Freq: Every day | ORAL | Status: DC

## 2019-04-02 MED ORDER — CHLORHEXIDINE GLUCONATE 4 % EX LIQD: 60.0000 mL | Freq: Once | CUTANEOUS | Status: DC

## 2019-04-02 MED ORDER — BISACODYL 10 MG RE SUPP: 10.0000 mg | Freq: Every day | RECTAL | Status: DC | PRN

## 2019-04-02 MED ORDER — OXYCODONE HCL 5 MG PO TABS: 5.0000 mg | ORAL_TABLET | Freq: Once | ORAL | Status: DC | PRN

## 2019-04-02 MED ORDER — FENTANYL CITRATE (PF) 100 MCG/2ML IJ SOLN
25.0000 ug | INTRAMUSCULAR | Status: DC | PRN
  Administered 2019-04-02: 50 ug via INTRAVENOUS
  Administered 2019-04-02: 25 ug via INTRAVENOUS

## 2019-04-02 MED ORDER — ONDANSETRON HCL 4 MG/2ML IJ SOLN: 4.0000 mg | Freq: Four times a day (QID) | INTRAMUSCULAR | Status: DC | PRN

## 2019-04-02 MED ORDER — ALLOPURINOL 100 MG PO TABS
100.0000 mg | ORAL_TABLET | Freq: Every day | ORAL | Status: DC
  Administered 2019-04-03 – 2019-04-04 (×2): 100 mg via ORAL
  Filled 2019-04-02 (×2): qty 1

## 2019-04-02 MED ORDER — PHENYLEPHRINE HCL (PRESSORS) 10 MG/ML IV SOLN
INTRAVENOUS | Status: DC | PRN
  Administered 2019-04-02 (×4): 100 ug via INTRAVENOUS

## 2019-04-02 MED ORDER — ACETAMINOPHEN 10 MG/ML IV SOLN
INTRAVENOUS | Status: DC | PRN
  Administered 2019-04-02: 1000 mg via INTRAVENOUS

## 2019-04-02 MED ORDER — DIPHENHYDRAMINE HCL 12.5 MG/5ML PO ELIX: 12.5000 mg | ORAL_SOLUTION | ORAL | Status: DC | PRN

## 2019-04-02 MED ORDER — FENTANYL CITRATE (PF) 100 MCG/2ML IJ SOLN
INTRAMUSCULAR | Status: AC
  Administered 2019-04-02: 25 ug via INTRAVENOUS
  Filled 2019-04-02: qty 2

## 2019-04-02 MED ORDER — BUPIVACAINE HCL (PF) 0.5 % IJ SOLN
INTRAMUSCULAR | Status: DC | PRN
  Administered 2019-04-02: 2.5 mL

## 2019-04-02 MED ORDER — SODIUM CHLORIDE 0.9 % IV SOLN
INTRAVENOUS | Status: DC | PRN
  Administered 2019-04-02: 60 mL

## 2019-04-02 MED ORDER — SODIUM CHLORIDE 0.9 % IV SOLN: INTRAVENOUS | Status: DC

## 2019-04-02 MED ORDER — BUPIVACAINE-EPINEPHRINE (PF) 0.5% -1:200000 IJ SOLN
INTRAMUSCULAR | Status: DC | PRN
  Administered 2019-04-02: 30 mL via PERINEURAL

## 2019-04-02 MED ORDER — TRANEXAMIC ACID 1000 MG/10ML IV SOLN
INTRAVENOUS | Status: DC | PRN
  Administered 2019-04-02: 09:00:00 1000 mg via TOPICAL

## 2019-04-02 MED ORDER — MIDAZOLAM HCL 5 MG/5ML IJ SOLN
INTRAMUSCULAR | Status: DC | PRN
  Administered 2019-04-02: 2 mg via INTRAVENOUS

## 2019-04-02 MED ORDER — FLEET ENEMA 7-19 GM/118ML RE ENEM: 1.0000 | ENEMA | Freq: Once | RECTAL | Status: DC | PRN

## 2019-04-02 MED ORDER — AMLODIPINE BESYLATE 10 MG PO TABS
10.0000 mg | ORAL_TABLET | Freq: Every day | ORAL | Status: DC
  Administered 2019-04-03 – 2019-04-04 (×2): 10 mg via ORAL
  Filled 2019-04-02 (×2): qty 1

## 2019-04-02 MED ORDER — PROPOFOL 500 MG/50ML IV EMUL
INTRAVENOUS | Status: AC
  Filled 2019-04-02: qty 50

## 2019-04-02 MED ORDER — CEFAZOLIN SODIUM-DEXTROSE 2-4 GM/100ML-% IV SOLN
INTRAVENOUS | Status: AC
  Filled 2019-04-02: qty 100

## 2019-04-02 MED ORDER — PHENYLEPHRINE HCL (PRESSORS) 10 MG/ML IV SOLN
INTRAVENOUS | Status: AC
  Filled 2019-04-02: qty 1

## 2019-04-02 MED ORDER — KETOROLAC TROMETHAMINE 15 MG/ML IJ SOLN: 15.0000 mg | Freq: Once | INTRAMUSCULAR | Status: DC

## 2019-04-02 MED ORDER — ACETAMINOPHEN 10 MG/ML IV SOLN
INTRAVENOUS | Status: AC
  Filled 2019-04-02: qty 100

## 2019-04-02 MED ORDER — SODIUM CHLORIDE FLUSH 0.9 % IV SOLN
INTRAVENOUS | Status: AC
  Filled 2019-04-02: qty 40

## 2019-04-02 MED ORDER — BUPIVACAINE HCL (PF) 0.5 % IJ SOLN
INTRAMUSCULAR | Status: AC
  Filled 2019-04-02: qty 10

## 2019-04-02 MED ORDER — ACETAMINOPHEN 325 MG PO TABS
325.0000 mg | ORAL_TABLET | Freq: Four times a day (QID) | ORAL | Status: DC | PRN
  Administered 2019-04-03 – 2019-04-04 (×2): 650 mg via ORAL
  Filled 2019-04-02 (×3): qty 2

## 2019-04-02 MED ORDER — KETOTIFEN FUMARATE 0.025 % OP SOLN
1.0000 [drp] | Freq: Two times a day (BID) | OPHTHALMIC | Status: DC
  Administered 2019-04-03 – 2019-04-04 (×2): 1 [drp] via OPHTHALMIC
  Filled 2019-04-02: qty 5

## 2019-04-02 MED ORDER — TAMSULOSIN HCL 0.4 MG PO CAPS
0.4000 mg | ORAL_CAPSULE | Freq: Every day | ORAL | Status: DC
  Administered 2019-04-02 – 2019-04-03 (×2): 0.4 mg via ORAL
  Filled 2019-04-02 (×2): qty 1

## 2019-04-02 MED ORDER — FAMOTIDINE 20 MG PO TABS: 20.0000 mg | ORAL_TABLET | Freq: Once | ORAL | Status: AC

## 2019-04-02 MED ORDER — ONDANSETRON HCL 4 MG PO TABS: 4.0000 mg | ORAL_TABLET | Freq: Four times a day (QID) | ORAL | Status: DC | PRN

## 2019-04-02 MED ORDER — PROPOFOL 500 MG/50ML IV EMUL
INTRAVENOUS | Status: DC | PRN
  Administered 2019-04-02: 75 ug/kg/min via INTRAVENOUS

## 2019-04-02 MED ORDER — METOCLOPRAMIDE HCL 10 MG PO TABS: 5.0000 mg | ORAL_TABLET | Freq: Three times a day (TID) | ORAL | Status: DC | PRN

## 2019-04-02 MED ORDER — MAGNESIUM HYDROXIDE 400 MG/5ML PO SUSP
30.0000 mL | Freq: Every day | ORAL | Status: DC | PRN
  Administered 2019-04-03: 30 mL via ORAL
  Filled 2019-04-02: qty 30

## 2019-04-02 MED ORDER — DOCUSATE SODIUM 100 MG PO CAPS
100.0000 mg | ORAL_CAPSULE | Freq: Two times a day (BID) | ORAL | Status: DC
  Administered 2019-04-02 – 2019-04-04 (×4): 100 mg via ORAL
  Filled 2019-04-02 (×5): qty 1

## 2019-04-02 MED ORDER — ADULT MULTIVITAMIN W/MINERALS CH
1.0000 | ORAL_TABLET | Freq: Every day | ORAL | Status: DC
  Administered 2019-04-03 – 2019-04-04 (×2): 1 via ORAL
  Filled 2019-04-02 (×4): qty 1

## 2019-04-02 MED ORDER — OXYCODONE HCL 5 MG/5ML PO SOLN: 5.0000 mg | Freq: Once | ORAL | Status: DC | PRN

## 2019-04-02 MED ORDER — BUPIVACAINE LIPOSOME 1.3 % IJ SUSP
INTRAMUSCULAR | Status: AC
  Filled 2019-04-02: qty 20

## 2019-04-02 MED ORDER — ASPIRIN EC 81 MG PO TBEC
81.0000 mg | DELAYED_RELEASE_TABLET | Freq: Every day | ORAL | Status: DC
  Administered 2019-04-03 – 2019-04-04 (×2): 81 mg via ORAL
  Filled 2019-04-02 (×2): qty 1

## 2019-04-02 MED ORDER — LIDOCAINE HCL (PF) 2 % IJ SOLN
INTRAMUSCULAR | Status: AC
  Filled 2019-04-02: qty 10

## 2019-04-02 MED ORDER — LACTATED RINGERS IV SOLN: INTRAVENOUS | Status: DC

## 2019-04-02 MED ORDER — ONDANSETRON HCL 4 MG/2ML IJ SOLN
INTRAMUSCULAR | Status: DC | PRN
  Administered 2019-04-02: 4 mg via INTRAVENOUS

## 2019-04-02 MED ORDER — METOCLOPRAMIDE HCL 5 MG/ML IJ SOLN: 5.0000 mg | Freq: Three times a day (TID) | INTRAMUSCULAR | Status: DC | PRN

## 2019-04-02 MED ORDER — ONDANSETRON HCL 4 MG/2ML IJ SOLN
INTRAMUSCULAR | Status: AC
  Filled 2019-04-02: qty 2

## 2019-04-02 MED ORDER — ACETAMINOPHEN 500 MG PO TABS
1000.0000 mg | ORAL_TABLET | Freq: Four times a day (QID) | ORAL | Status: AC
  Administered 2019-04-02 – 2019-04-03 (×3): 1000 mg via ORAL
  Filled 2019-04-02 (×3): qty 2

## 2019-04-02 MED ORDER — KETOROLAC TROMETHAMINE 15 MG/ML IJ SOLN
7.5000 mg | Freq: Four times a day (QID) | INTRAMUSCULAR | Status: AC
  Administered 2019-04-02 – 2019-04-03 (×3): 7.5 mg via INTRAVENOUS
  Filled 2019-04-02 (×3): qty 1

## 2019-04-02 MED ORDER — FENTANYL CITRATE (PF) 100 MCG/2ML IJ SOLN
INTRAMUSCULAR | Status: AC
  Filled 2019-04-02: qty 2

## 2019-04-02 MED ORDER — HYDROMORPHONE HCL 1 MG/ML IJ SOLN: 0.5000 mg | INTRAMUSCULAR | Status: DC | PRN

## 2019-04-02 MED ORDER — CYCLOBENZAPRINE HCL 10 MG PO TABS: 10.0000 mg | ORAL_TABLET | Freq: Three times a day (TID) | ORAL | Status: DC | PRN

## 2019-04-02 MED ORDER — SODIUM CHLORIDE 0.9 % IV SOLN
INTRAVENOUS | Status: DC | PRN
  Administered 2019-04-02: 30 ug/min via INTRAVENOUS

## 2019-04-02 MED ORDER — CEFAZOLIN SODIUM-DEXTROSE 2-4 GM/100ML-% IV SOLN
2.0000 g | Freq: Four times a day (QID) | INTRAVENOUS | Status: AC
  Administered 2019-04-02 – 2019-04-03 (×3): 2 g via INTRAVENOUS
  Filled 2019-04-02 (×4): qty 100

## 2019-04-02 MED ORDER — LORATADINE 10 MG PO TABS
10.0000 mg | ORAL_TABLET | Freq: Every day | ORAL | Status: DC
  Administered 2019-04-03 – 2019-04-04 (×2): 10 mg via ORAL
  Filled 2019-04-02 (×2): qty 1

## 2019-04-02 MED ORDER — FAMOTIDINE 20 MG PO TABS
20.0000 mg | ORAL_TABLET | Freq: Every day | ORAL | Status: DC
  Administered 2019-04-02 – 2019-04-03 (×2): 20 mg via ORAL
  Filled 2019-04-02 (×2): qty 1

## 2019-04-02 SURGICAL SUPPLY — 65 items
BEARING TIBIAL VG AS 79X12 (Joint) IMPLANT
BEARING VGRD TIBIAL KNEE 18X71 (Knees) ×1 IMPLANT
BIT DRILL QUICK REL 1/8 2PK SL (DRILL) IMPLANT
BLADE SAW SAG 25X90X1.19 (BLADE) ×2 IMPLANT
BLADE SURG SZ20 CARB STEEL (BLADE) ×2 IMPLANT
BNDG ELASTIC 6X5.8 VLCR NS LF (GAUZE/BANDAGES/DRESSINGS) ×2 IMPLANT
BNDG ELASTIC 6X5.8 VLCR STR LF (GAUZE/BANDAGES/DRESSINGS) ×1 IMPLANT
CANISTER SUCT 1200ML W/VALVE (MISCELLANEOUS) ×2 IMPLANT
CANISTER SUCT 3000ML PPV (MISCELLANEOUS) ×2 IMPLANT
CEMENT BONE R 1X40 (Cement) ×4 IMPLANT
CEMENT VACUUM MIXING SYSTEM (MISCELLANEOUS) ×2 IMPLANT
CHLORAPREP W/TINT 26 (MISCELLANEOUS) ×2 IMPLANT
COOLER POLAR GLACIER W/PUMP (MISCELLANEOUS) ×2 IMPLANT
COVER MAYO STAND REUSABLE (DRAPES) ×2 IMPLANT
COVER WAND RF STERILE (DRAPES) ×2 IMPLANT
CUFF TOURN SGL QUICK 24 (TOURNIQUET CUFF)
CUFF TOURN SGL QUICK 30 (TOURNIQUET CUFF) ×1
CUFF TRNQT CYL 24X4X16.5-23 (TOURNIQUET CUFF) IMPLANT
CUFF TRNQT CYL 30X4X21-28X (TOURNIQUET CUFF) IMPLANT
DRAPE 3/4 80X56 (DRAPES) ×2 IMPLANT
DRILL QUICK RELEASE 1/8 INCH (DRILL) ×1
DRSG OPSITE POSTOP 4X10 (GAUZE/BANDAGES/DRESSINGS) ×2 IMPLANT
DRSG OPSITE POSTOP 4X8 (GAUZE/BANDAGES/DRESSINGS) ×1 IMPLANT
ELECT CAUTERY BLADE 6.4 (BLADE) ×2 IMPLANT
ELECT REM PT RETURN 9FT ADLT (ELECTROSURGICAL) ×2
ELECTRODE REM PT RTRN 9FT ADLT (ELECTROSURGICAL) ×1 IMPLANT
FEMORAL COMPONENT 70MM RIGHT (Joint) ×1 IMPLANT
GLOVE BIO SURGEON STRL SZ7.5 (GLOVE) ×8 IMPLANT
GLOVE BIO SURGEON STRL SZ8 (GLOVE) ×9 IMPLANT
GLOVE BIOGEL PI IND STRL 8 (GLOVE) ×1 IMPLANT
GLOVE BIOGEL PI INDICATOR 8 (GLOVE) ×1
GLOVE INDICATOR 8.0 STRL GRN (GLOVE) ×3 IMPLANT
GOWN STRL REUS W/ TWL LRG LVL3 (GOWN DISPOSABLE) ×1 IMPLANT
GOWN STRL REUS W/ TWL XL LVL3 (GOWN DISPOSABLE) ×1 IMPLANT
GOWN STRL REUS W/TWL LRG LVL3 (GOWN DISPOSABLE) ×3
GOWN STRL REUS W/TWL XL LVL3 (GOWN DISPOSABLE) ×1
HOLDER FOLEY CATH W/STRAP (MISCELLANEOUS) ×1 IMPLANT
HOOD PEEL AWAY FLYTE STAYCOOL (MISCELLANEOUS) ×7 IMPLANT
KIT TURNOVER KIT A (KITS) ×2 IMPLANT
NDL SAFETY ECLIPSE 18X1.5 (NEEDLE) ×2 IMPLANT
NDL SPNL 20GX3.5 QUINCKE YW (NEEDLE) ×1 IMPLANT
NEEDLE HYPO 18GX1.5 SHARP (NEEDLE) ×2
NEEDLE SPNL 20GX3.5 QUINCKE YW (NEEDLE) ×2 IMPLANT
NS IRRIG 1000ML POUR BTL (IV SOLUTION) ×2 IMPLANT
PACK TOTAL KNEE (MISCELLANEOUS) ×2 IMPLANT
PAD WRAPON POLAR KNEE (MISCELLANEOUS) ×1 IMPLANT
PATELLA STD 34X8.5 (Orthopedic Implant) ×1 IMPLANT
PENCIL SMOKE EVACUATOR (MISCELLANEOUS) ×1 IMPLANT
PLATE KNEE TIBIAL 79MM FIXED (Plate) ×1 IMPLANT
PULSAVAC PLUS IRRIG FAN TIP (DISPOSABLE) ×2
SOL .9 NS 3000ML IRR  AL (IV SOLUTION) ×1
SOL .9 NS 3000ML IRR UROMATIC (IV SOLUTION) ×1 IMPLANT
STAPLER SKIN PROX 35W (STAPLE) ×2 IMPLANT
SUCTION FRAZIER HANDLE 10FR (MISCELLANEOUS) ×1
SUCTION TUBE FRAZIER 10FR DISP (MISCELLANEOUS) ×1 IMPLANT
SUT VIC AB 0 CT1 36 (SUTURE) ×6 IMPLANT
SUT VIC AB 2-0 CT1 27 (SUTURE) ×4
SUT VIC AB 2-0 CT1 TAPERPNT 27 (SUTURE) ×3 IMPLANT
SYR 10ML LL (SYRINGE) ×2 IMPLANT
SYR 20ML LL LF (SYRINGE) ×2 IMPLANT
SYR 30ML LL (SYRINGE) ×6 IMPLANT
TIBIAL BEARING VG AS 79X12 (Joint) ×2 IMPLANT
TIP FAN IRRIG PULSAVAC PLUS (DISPOSABLE) ×1 IMPLANT
TRAY FOLEY MTR SLVR 16FR STAT (SET/KITS/TRAYS/PACK) ×1 IMPLANT
WRAPON POLAR PAD KNEE (MISCELLANEOUS) ×2

## 2019-04-02 NOTE — H&P (Signed)
Paper H&P to be scanned into permanent record. H&P reviewed and patient re-examined. No changes. 

## 2019-04-02 NOTE — TOC Initial Note (Signed)
Transition of Care [facility]) - Initial/Assessment Note    Patient Details  Name: John Peterson MRN: [id number] Date of Birth: 10/10/1945  Transition of Care [facility]) CM/SW Contact:    Elease Hashimoto, LCSW Phone Number: 04/02/2019, 2:32 PM  Clinical Narrative: Met with pt and wife who is in the room to discuss discharge needs. He has a rolling walker and elevated commode along with grab rails in their bathroom. He feels this is good for now and does not want a bsc. He is aware of [facility] coming out to see him upon discharge, he has used them before. Wife is in good health and can assist with his care if needed. Pt was independent prior to admission and hopes after he heals he will be in less pain and moving better. Will follow and see if any other DC needs. Lovenox is covered by his insurance with co-pay of $10.00, pt and wife made aware of this. PT in room to see pt.                Expected Discharge Plan: [place] Barriers to Discharge: Continued Medical Work up   Patient Goals and CMS Choice Patient states their goals for this hospitalization and ongoing recovery are:: Hope I do well and can go home in the next day or two      Expected Discharge Plan and Services Expected Discharge Plan: [place] In-house Referral: Clinical Social Work   Post Acute Care Choice: [place] arrangements for the past 2 months: Single Family Home                           HH Arranged: RN, PT [place] Agency: [facility]) Date [place]: 04/02/19 Time [place]: [place] Representative spoke with at [place]: teresa  Prior Living Arrangements/Services Living arrangements for the past 2 months: [place] with:: Spouse          Need for Family Participation in Patient Care: Yes (Comment) Care giver support system in place?: Yes (comment) Current home services: DME(has rw and elevated commode  with rails)    Activities of Daily Living Home Assistive Devices/Equipment: Cane (specify quad or straight) ADL Screening (condition at time of admission) Patient's cognitive ability adequate to safely complete daily activities?: Yes Is the patient deaf or have difficulty hearing?: No Does the patient have difficulty seeing, even when wearing glasses/contacts?: No Does the patient have difficulty concentrating, remembering, or making decisions?: No Patient able to express need for assistance with ADLs?: Yes Does the patient have difficulty dressing or bathing?: No Independently performs ADLs?: Yes (appropriate for developmental age) Does the patient have difficulty walking or climbing stairs?: No Weakness of Legs: None Weakness of Arms/Hands: None  Permission Sought/Granted                  Emotional Assessment Appearance:: Appears stated age Attitude/Demeanor/Rapport: Gracious Affect (typically observed): Accepting, Adaptable Orientation: : Oriented to Self, Oriented to Place, Oriented to  Time, Oriented to Situation      Admission diagnosis:  Status post total knee replacement using cement, right [Z96.651] Patient Active Problem List   Diagnosis Date Noted  . Status post total knee replacement using cement, right 04/02/2019  . Essential hypertension, benign 06/07/2016  . Hyperlipidemia 06/07/2016  . Carotid stenosis 06/07/2016   PCP:  Baxter Hire, MD Pharmacy:  [id number] [place] [id number]: [phone]: [phone]    Social Determinants of Health (SDOH) Interventions    Readmission Risk Interventions No flowsheet data found.

## 2019-04-02 NOTE — TOC Progression Note (Signed)
Transition of Care [facility]) - Progression Note    Patient Details  Name: John Peterson MRN: [id number] Date of Birth: [birth year]  Transition of Care [facility]) CM/SW Contact  Demeshia Sherburne, Gardiner Rhyme, LCSW Phone Number: 04/02/2019, 3:28 PM  Clinical Narrative:    Met with pt and wife who have decided now they will need a rolling walker the walker they have is a rollator and wife got it at the [facility], so was not covered by insurance. Have ordered a rw from [employer] and will have delivered tomorrow to room. Pt probably going home tomorrow if does well in PT. See in am to answer any last minute questions prior to DC    Expected Discharge Plan: [place] Barriers to Discharge: Continued Medical Work up  Expected Discharge Plan and Services Expected Discharge Plan: [place] In-house Referral: Clinical Social Work   Post Acute Care Choice: [place] arrangements for the past 2 months: [place]: RN, PT [place] Agency: [facility] (now [facility]) Date [place]: 04/02/19 Time [place]: [place] Representative spoke with at [place]: teresa   Social Determinants of Health ([place]) Interventions    Readmission Risk Interventions No flowsheet data found.

## 2019-04-02 NOTE — Progress Notes (Signed)
15 minute call to floor.  In and out catheter performed for urine of 403 per bladder scan. Patient tolerated well.

## 2019-04-02 NOTE — Op Note (Signed)
04/02/2019  9:57 AM  Patient:   John Peterson  Pre-Op Diagnosis:   Degenerative joint disease, right knee.  Post-Op Diagnosis:   Same  Procedure:   Right TKA using all-cemented Bi[employer] Vanguard system with a 70 mm PCR femur, a 79 mm tibial tray with a 12 mm anterior stabilized E-poly insert, and a 34 x 8.5 mm all-poly 3-pegged domed patella.  Surgeon:   Pascal Lux, MD  Assistant:   Cameron Proud, PA-C   Anesthesia:   Spinal  Findings:   As above  Complications:   None  EBL:   10 cc  Fluids:   800 cc crystalloid  UOP:   None  TT:   95 minutes at 300 mmHg  Drains:   None  Closure:   Staples  Implants:   As above  Brief Clinical Note:   The patient is a 74 year old male with a long history of progressively worsening right knee pain. The patient's symptoms have progressed despite medications, activity modification, injections, etc. The patient's history and examination were consistent with advanced degenerative joint disease of the right knee confirmed by plain radiographs. The patient presents at this time for a right total knee arthroplasty.  Procedure:   The patient was brought into the operating room. After adequate spinal anesthesia was obtained, the patient was lain in the supine position. A Foley catheter was placed by the nurse before the right lower extremity was prepped with ChloraPrep solution and draped sterilely. Preoperative antibiotics were administered. After verifying the proper laterality with a surgical timeout, the limb was exsanguinated with an Esmarch and the tourniquet inflated to 300 mmHg. A standard anterior approach to the knee was made through an approximately 7 inch incision. The incision was carried down through the subcutaneous tissues to expose superficial retinaculum. This was split the length of the incision and the medial flap elevated sufficiently to expose the medial retinaculum. The medial retinaculum was incised, leaving a 3-4 mm cuff of  tissue on the patella. This was extended distally along the medial border of the patellar tendon and proximally through the medial third of the quadriceps tendon. A subtotal fat pad excision was performed before the soft tissues were elevated off the anteromedial and anterolateral aspects of the proximal tibia to the level of the collateral ligaments. The anterior portions of the medial and lateral menisci were removed, as was the anterior cruciate ligament. With the knee flexed to 90, the external tibial guide was positioned and the appropriate proximal tibial cut made. This piece was taken to the back table where it was measured and found to be optimally replicated by a 79 mm component.  Attention was directed to the distal femur. The intramedullary canal was accessed through a 3/8" drill hole. The intramedullary guide was inserted and positioned in order to obtain a neutral flexion gap. The intercondylar block was positioned with care taken to avoid notching the anterior cortex of the femur. The appropriate cut was made. Next, the distal cutting block was placed at 5 of valgus alignment. Using the 9 mm slot, the distal cut was made. The distal femur was measured and found to be optimally replicated by the 70 mm component. The 70 mm 4-in-1 cutting block was positioned and first the posterior, then the posterior chamfer, the anterior chamfer, and finally the anterior cuts were made. At this point, the posterior portions medial and lateral menisci were removed. A trial reduction was performed using the appropriate femoral and tibial components with first  the 10 mm and then the 12 mm insert. The 12 mm insert demonstrated excellent stability to varus and valgus stressing both in flexion and extension while permitting full extension. Patella tracking was assessed and found to be excellent. Therefore, the tibial guide position was marked on the proximal tibia. The patella thickness was measured and found to be 22  mm. Therefore, the appropriate cut was made. The patellar surface was measured and found to be optimally replicated by the 34 mm component. The three peg holes were drilled in place before the trial button was inserted. Patella tracking was assessed and found to be excellent, passing the "no thumb test". The lug holes were drilled into the distal femur before the trial component was removed, leaving only the tibial tray. The keel was then created using the appropriate tower, reamer, and punch.  The bony surfaces were prepared for cementing by irrigating them thoroughly with bacitracin saline solution via the jet lavage system. A bone plug was fashioned from some of the bone that had been removed previously and used to plug the distal femoral canal. In addition, 20 cc of Exparel diluted out to 60 cc with normal saline and 30 cc of 0.5% Sensorcaine were injected into the postero-medial and postero-lateral aspects of the knee, the medial and lateral gutter regions, and the peri-incisional tissues to help with postoperative analgesia. Meanwhile, the cement was being mixed on the back table. When it was ready, the tibial tray was cemented in first. The excess cement was removed using [employer] fast streamer. Next, the femoral component was impacted into place. Again, the excess cement was removed using [employer] fast streamer. The 12 mm trial insert was positioned and the knee brought into extension while the cement hardened. Finally, the patella was cemented into place and secured using the patellar clamp. Again, the excess cement was removed using [employer] fast streamer. Once the cement had hardened, the knee was placed through a range of motion with the findings as described above. Therefore, the trial insert was removed and, after verifying that no cement had been retained posteriorly, the permanent 12 mm anterior stabilized E-polyethylene insert was positioned and secured using the appropriate key locking mechanism. Again the knee was  placed through a range of motion with the findings as described above.  The wound was copiously irrigated with sterile saline solution using the jet lavage system before the quadriceps tendon and retinacular layer were reapproximated using #0 Vicryl interrupted sutures. The superficial retinacular layer also was closed using a running #0 Vicryl suture. A total of 10 cc of transexemic acid (TXA) was injected intra-articularly before the subcutaneous tissues were closed in several layers using 2-0 Vicryl interrupted sutures. The skin was closed using staples. A sterile honeycomb dressing was applied to the skin before the leg was wrapped with an Ace wrap to accommodate the Polar Care device. The patient was then awakened and returned to the recovery room in satisfactory condition after tolerating the procedure well.

## 2019-04-02 NOTE — Anesthesia Procedure Notes (Deleted)
Spinal

## 2019-04-02 NOTE — Progress Notes (Signed)
Xray here for patient.

## 2019-04-02 NOTE — Anesthesia Preprocedure Evaluation (Signed)
Anesthesia Evaluation  Patient identified by MRN, date of birth, ID band Patient awake    Reviewed: Allergy & Precautions, H&P , NPO status , Patient's Chart, lab work & pertinent test results  History of Anesthesia Complications Negative for: history of anesthetic complications  Airway Mallampati: III  TM Distance: <3 FB Neck ROM: full    Dental  (+) Chipped   Pulmonary neg shortness of breath, former smoker,           Cardiovascular Exercise Tolerance: Good hypertension, (-) angina+ Peripheral Vascular Disease  (-) Past MI and (-) DOE      Neuro/Psych negative neurological ROS  negative psych ROS   GI/Hepatic Neg liver ROS, GERD  Medicated and Controlled,  Endo/Other  negative endocrine ROS  Renal/GU      Musculoskeletal   Abdominal   Peds  Hematology negative hematology ROS (+)   Anesthesia Other Findings Past Medical History: No date: Allergy No date: Arthritis No date: 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 No date: GERD (gastroesophageal reflux disease) No date: Hyperlipidemia No date: Hypertension No date: Peripheral vascular disease [facility])  Past Surgical History: 11/1998: HYDROCELE EXCISION 03/06/2013: JOINT REPLACEMENT; Left     Comment:  L Knee Medial Compartment Makoplasty 03/02/2016: JOINT REPLACEMENT; Left     Comment:  L Knee Unicondylar converted to Total Replacement Mako 07/2017: MOLE REMOVAL  BMI    Body Mass Index: 33.24 kg/m      Reproductive/Obstetrics negative OB ROS                             Anesthesia Physical Anesthesia Plan  ASA: III  Anesthesia Plan: Spinal   Post-op Pain Management:    Induction:   PONV Risk Score and Plan:   Airway Management Planned: Natural Airway and Nasal Cannula  Additional Equipment:   Intra-op Plan:   Post-operative Plan:   Informed Consent: I have reviewed the patients History and Physical, chart, labs and  discussed the procedure including the risks, benefits and alternatives for the proposed anesthesia with the patient or authorized representative who has indicated his/her understanding and acceptance.     Dental Advisory Given  Plan Discussed with: Anesthesiologist, CRNA and Surgeon  Anesthesia Plan Comments: (Patient reports no bleeding problems and no anticoagulant use.  Plan for spinal with backup GA  Patient consented for risks of anesthesia including but not limited to:  - adverse reactions to medications - risk of bleeding, infection, nerve damage and headache - risk of failed spinal - damage to teeth, lips or other oral mucosa - sore throat or hoarseness - Damage to heart, brain, lungs or loss of life  Patient voiced understanding.)        Anesthesia Quick Evaluation

## 2019-04-02 NOTE — Evaluation (Signed)
Physical Therapy Evaluation Patient Details Name: John Peterson MRN: [id number] DOB: April 01, 1945 Today's Date: 04/02/2019   History of Present Illness  Pt is 73 yo male s/p R TKA, WBAT. PMH of smoking, HTN, PVD, GERD, HLD, cancer (CTCL), L TKA. Pt exhibited mild UE tremor, reported it has been present since his twenties.    Clinical Impression  Pt alert, family at bedside, reported 4/10 R knee pain. Stated prior to surgery pt would utilize [facility], did have an instance of increased R knee pain where he utilized a rollator, one level house, lives with his wife.   The patient was able to perform supine exercises with multimodal cueing, physical assist needed for heel slides. Supine to sit with minA for RLE management. Pt needed several minutes in sitting due to pt reported "vertigo" improved over time. The patient performed sit <> stand with RW, elevated surface and CGA, and took several steps to recliner in room. Step to gait pattern utilized, antalgic. Pt in chair with all needs in reach.  Overall the patient demonstrated deficits (see "PT Problem List") that impede the patient's functional abilities, safety, and mobility and would benefit from skilled PT intervention. Recommendation HHPT with supervision for mobility/OOB.      Follow Up Recommendations Home health PT;Supervision for mobility/OOB    Equipment Recommendations  Rolling walker with 5" wheels    Recommendations for Other Services OT consult     Precautions / Restrictions Precautions Precautions: Fall;Knee Precaution Booklet Issued: Yes (comment) Restrictions Weight Bearing Restrictions: Yes RLE Weight Bearing: Weight bearing as tolerated      Mobility  Bed Mobility Overal bed mobility: Needs Assistance Bed Mobility: Supine to Sit     Supine to sit: HOB elevated;Min assist     General bed mobility comments: for RLE management  Transfers Overall transfer level: Needs assistance Equipment used: Rolling walker (2  wheeled) Transfers: Sit to/from Stand Sit to Stand: Min guard;From elevated surface         General transfer comment: utilized LLE predominantly  Ambulation/Gait Ambulation/Gait assistance: Counsellor (Feet): 2 Feet Assistive device: Rolling walker (2 wheeled) Gait Pattern/deviations: Antalgic;Step-to pattern        Stairs            Wheelchair Mobility    Modified Rankin (Stroke Patients Only)       Balance Overall balance assessment: Needs assistance Sitting-balance support: Feet supported Sitting balance-Leahy Scale: Fair       Standing balance-Leahy Scale: Poor Standing balance comment: reliant on RW during functional mobility                             Pertinent Vitals/Pain Pain Assessment: 0-10 Pain Score: 4  Pain Location: R knee Pain Descriptors / Indicators: Aching;Grimacing;Sore Pain Intervention(s): Limited activity within patient's tolerance;Monitored during session;Repositioned;Ice applied    Home Living Family/patient expects to be discharged to:: Private residence Living Arrangements: Spouse/significant other Available Help at Discharge: Family;Available 24 hours/day Type of Home: House Home Access: Level entry     Home Layout: One level Home Equipment: Toilet riser;Grab bars - toilet;Walker - 4 wheels      Prior Function Level of Independence: Independent with assistive device(s)         Comments: utilized SPC for ambulation, once utilized rollator due to significant knee pain     Hand Dominance        Extremity/Trunk Assessment   Upper Extremity Assessment Upper Extremity Assessment:  Overall [place] for tasks assessed    Lower Extremity Assessment Lower Extremity Assessment: RLE deficits/detail;LLE deficits/detail RLE Deficits / Details: s/p R TKA LLE Deficits / Details: WFLs    Cervical / Trunk Assessment Cervical / Trunk Assessment: Normal  Communication   Communication: No difficulties   Cognition Arousal/Alertness: Awake/alert Behavior During Therapy: WFL for tasks assessed/performed Overall Cognitive Status: Within Functional Limits for tasks assessed                                        General Comments      Exercises Total Joint Exercises Ankle Circles/Pumps: AROM;Both;10 reps Quad Sets: AROM;10 reps;Strengthening;Right Heel Slides: AAROM;Strengthening;Right;10 reps Goniometric ROM: -6 to 70degs sitting edge of bed   Assessment/Plan    PT Assessment Patient needs continued PT services  PT Problem List Decreased strength;Decreased mobility;Decreased range of motion;Decreased activity tolerance;Decreased balance;Decreased knowledge of use of DME;Decreased knowledge of precautions;Pain       PT Treatment Interventions DME instruction;Therapeutic exercise;Gait training;Balance training;Neuromuscular re-education;Functional mobility training;Therapeutic activities;Patient/family education    PT Goals (Current goals can be found in the Care Plan section)  Acute Rehab PT Goals Patient Stated Goal: to go home PT Goal Formulation: With patient Time For Goal Achievement: 04/16/19 Potential to Achieve Goals: Good    Frequency BID   Barriers to discharge        Co-evaluation               AM-PAC PT "6 Clicks" Mobility  Outcome Measure Help needed turning from your back to your side while in a flat bed without using bedrails?: A Little Help needed moving from lying on your back to sitting on the side of a flat bed without using bedrails?: A Little Help needed moving to and from a bed to a chair (including a wheelchair)?: A Little Help needed standing up from a chair using your arms (e.g., wheelchair or bedside chair)?: A Little Help needed to walk in hospital room?: A Little Help needed climbing 3-5 steps with a railing? : A Lot 6 Click Score: 17    End of Session Equipment Utilized During Treatment: Gait belt Activity Tolerance:  Patient tolerated treatment well Patient left: in chair;with chair alarm set;with call bell/phone within reach;with family/visitor present;with SCD's reapplied(heels elevated, polar care in place) Nurse Communication: Mobility status PT Visit Diagnosis: Other abnormalities of gait and mobility (R26.89);Muscle weakness (generalized) (M62.81);Difficulty in walking, not elsewhere classified (R26.2);Pain Pain - Right/Left: Right Pain - part of body: Knee    Time: [id number] PT Time Calculation (min) (ACUTE ONLY): 40 min   Charges:   PT Evaluation $PT Eval Low Complexity: 1 Low PT Treatments $Therapeutic Exercise: 23-37 mins        Lieutenant Diego PT, DPT 4:28 PM,04/02/19

## 2019-04-02 NOTE — Transfer of Care (Signed)
Immediate Anesthesia Transfer of Care Note  Patient: John Peterson  Procedure(s) Performed: TOTAL KNEE ARTHROPLASTY (Right Knee)  Patient Location: PACU  Anesthesia Type:Spinal  Level of Consciousness: awake, alert , oriented and patient cooperative  Airway & Oxygen Therapy: Patient Spontanous Breathing and Patient connected to face mask  Post-op Assessment: Report given to RN, Post -op Vital signs reviewed and stable and Patient moving all extremities X 4  Post vital signs: Reviewed and stable  Last Vitals:  Vitals Value Taken Time  BP 113/64 04/02/19 0953  Temp 36.3 C 04/02/19 0953  Pulse 76 04/02/19 0958  Resp 14 04/02/19 0958  SpO2 97 % 04/02/19 0958  Vitals shown include unvalidated device data.  Last Pain:  Vitals:   04/02/19 0953  TempSrc:   PainSc: 0-No pain         Complications: No apparent anesthesia complications

## 2019-04-02 NOTE — Anesthesia Procedure Notes (Signed)
Spinal  Patient location during procedure: OR Start time: 04/02/2019 7:30 AM End time: 04/02/2019 7:31 AM Staffing Performed: other anesthesia staff  Anesthesiologist: Piscitello, Precious Haws, MD Resident/CRNA: Doreen Salvage, CRNA Other anesthesia staff: Hart Rochester, RN Preanesthetic Checklist Completed: patient identified, IV checked, site marked, risks and benefits discussed, surgical consent, monitors and equipment checked, pre-op evaluation and timeout performed Spinal Block Patient position: sitting Prep: DuraPrep Patient monitoring: heart rate, cardiac monitor, continuous pulse ox and blood pressure Approach: midline Location: L3-4 Injection technique: single-shot Needle Needle type: [place]  Needle gauge: 24 G Needle length: 9 cm Assessment Sensory level: T4

## 2019-04-02 NOTE — TOC Benefit Eligibility Note (Signed)
Transition of Care [facility]) Benefit Eligibility Note    Patient Details  Name: DALVON CENTNER MRN: [id number] Date of Birth: [birth year]   Medication/Dose: Enoxaparin 40mg  once daily for 14 days  Covered?: Yes  Prescription Coverage Preferred Pharmacy: [facility]  Spoke with Person/Company/Phone Number:: Liliane Channel with [facility] at [phone]  Co-Pay: $10 esimated copay  Prior Approval: No(Per rep, patient has transitional benefit in place.  Until 05/08/2019, patient can obtain this medication without PA as a one time courtesy.  For future refills, would require PA.)  Deductible: (No deductible on plan, in initial coverage stage.)   Dannette Barbara Phone Number: [phone] or [phone] 04/02/2019, 1:34 PM

## 2019-04-03 LAB — BASIC METABOLIC PANEL
Anion gap: 5 (ref 5–15)
BUN: 14 mg/dL (ref 8–23)
CO2: 28 mmol/L (ref 22–32)
Calcium: 8.5 mg/dL — ABNORMAL LOW (ref 8.9–10.3)
Chloride: 103 mmol/L (ref 98–111)
Creatinine, Ser: 0.87 mg/dL (ref 0.61–1.24)
GFR calc Af Amer: >60 mL/min (ref >60)
GFR calc non Af Amer: >60 mL/min (ref >60)
Glucose, Bld: 116 mg/dL — ABNORMAL HIGH (ref 70–99)
Potassium: 3.5 mmol/L (ref 3.5–5.1)
Sodium: 136 mmol/L (ref 135–145)

## 2019-04-03 LAB — CBC
HCT: 35.7 % — ABNORMAL LOW (ref 39.0–52.0)
Hemoglobin: 11.9 g/dL — ABNORMAL LOW (ref 13.0–17.0)
MCH: 31.7 pg (ref 26.0–34.0)
MCHC: 33.3 g/dL (ref 30.0–36.0)
MCV: 95.2 fL (ref 80.0–100.0)
Platelets: 160 10*3/uL (ref 150–400)
RBC: 3.75 MIL/uL — ABNORMAL LOW (ref 4.22–5.81)
RDW: 12.9 % (ref 11.5–15.5)
WBC: 8.5 10*3/uL (ref 4.0–10.5)
nRBC: 0 % (ref 0.0–0.2)

## 2019-04-03 MED ORDER — APIXABAN 2.5 MG PO TABS
2.5000 mg | ORAL_TABLET | Freq: Two times a day (BID) | ORAL | Status: DC
  Administered 2019-04-03 – 2019-04-04 (×2): 2.5 mg via ORAL
  Filled 2019-04-03 (×2): qty 1

## 2019-04-03 NOTE — Progress Notes (Signed)
D: Pt alert and oriented x 4.    A: Scheduled medications administered to pt, per MD orders. Support and encouragement provided. Frequent verbal contact made.  R: No adverse drug reactions noted. Pt complaint with medications and treatment plan. Pt interacts well with staff on the unit. Pt is stable at this time, will continue to monitor and provide care for as ordered.  Pt OOB with PT today.

## 2019-04-03 NOTE — Anesthesia Postprocedure Evaluation (Signed)
Anesthesia Post Note  Patient: John Peterson  Procedure(s) Performed: TOTAL KNEE ARTHROPLASTY (Right Knee)  Patient location during evaluation: Nursing Unit Anesthesia Type: Spinal Level of consciousness: oriented and awake and alert Pain management: pain level controlled Vital Signs Assessment: post-procedure vital signs reviewed and stable Respiratory status: spontaneous breathing and respiratory function stable Cardiovascular status: blood pressure returned to baseline and stable Postop Assessment: no headache, no backache, no apparent nausea or vomiting and patient able to bend at knees Anesthetic complications: no     Last Vitals:  Vitals:   04/03/19 0354 04/03/19 0748  BP: 114/62 (!) 141/73  Pulse: 87 93  Resp: 16 17  Temp: 36.9 C 36.8 C  SpO2: 93% 93%    Last Pain:  Vitals:   04/03/19 0748  TempSrc: Oral  PainSc:                  Alison Stalling

## 2019-04-03 NOTE — Progress Notes (Signed)
Subjective: 1 Day Post-Op Procedure(s) (LRB): TOTAL KNEE ARTHROPLASTY (Right) Patient reports pain as mild.   Patient is well, and has had no acute complaints or problems Plan is to go Home after hospital stay. Negative for chest pain and shortness of breath Fever: no Gastrointestinal:Negative for nausea and vomiting, did have mild nausea with PT yesterday.  Objective: Vital signs in last 24 hours: Temp:  [97.4 F (36.3 C)-99.3 F (37.4 C)] 98.3 F (36.8 C) (02/24 0748) Pulse Rate:  [60-97] 93 (02/24 0748) Resp:  [10-17] 17 (02/24 0748) BP: (107-150)/(62-82) 141/73 (02/24 0748) SpO2:  [92 %-99 %] 93 % (02/24 0748)  Intake/Output from previous day:  Intake/Output Summary (Last 24 hours) at 04/03/2019 0843 Last data filed at 04/03/2019 0356 Gross per 24 hour  Intake 1531.25 ml  Output 1510 ml  Net 21.25 ml    Intake/Output this shift: No intake/output data recorded.  Labs: Recent Labs    04/03/19 0303  HGB 11.9*   Recent Labs    04/03/19 0303  WBC 8.5  RBC 3.75*  HCT 35.7*  PLT 160   Recent Labs    04/03/19 0303  NA 136  K 3.5  CL 103  CO2 28  BUN 14  CREATININE 0.87  GLUCOSE 116*  CALCIUM 8.5*   No results for input(s): LABPT, INR in the last 72 hours.   EXAM General - Patient is Alert, Appropriate and Oriented Extremity - ABD soft Neurovascular intact Sensation intact distally Intact pulses distally Dorsiflexion/Plantar flexion intact Incision: dressing C/D/I No cellulitis present Dressing/Incision - clean, dry, no drainage Motor Function - intact, moving foot and toes well on exam.   Past Medical History:  Diagnosis Date  . Allergy   . Arthritis   . Cancer ([place])    CTCL  . GERD (gastroesophageal reflux disease)   . Hyperlipidemia   . Hypertension   . Peripheral vascular disease (HCC)     Assessment/Plan: 1 Day Post-Op Procedure(s) (LRB): TOTAL KNEE ARTHROPLASTY (Right) Active Problems:   Status post total knee replacement using  cement, right  Estimated body mass index is 33.24 kg/m as calculated from the following:   Height as of this encounter: 5' 10.5" (1.791 m).   Weight as of this encounter: 106.6 kg. Advance diet Up with therapy D/C IV fluids when tolerating po intake.  Labs reviewed this morning, Hg 11.9. CBC and BMP ordered for tomorrow morning. Continue with PT today. Begin working on a BM. Plan for possible d/c home tomorrow.  DVT Prophylaxis - Lovenox and Foot Pumps Weight-Bearing as tolerated to right leg  J. Cameron Proud, PA-C [facility] Orthopaedic Surgery 04/03/2019, 8:43 AM

## 2019-04-03 NOTE — Progress Notes (Signed)
Physical Therapy Treatment Patient Details Name: John Peterson MRN: [id number] DOB: 08-12-1945 Today's Date: 04/03/2019    History of Present Illness Pt is 74 yo male s/p R TKA, WBAT. PMH of smoking, HTN, PVD, GERD, HLD, cancer (CTCL), L TKA. Pt exhibited mild UE tremor, reported it has been present since his twenties.    PT Comments    Pt was long sitting in bed upon arriving. He is A and O x 4 and agreeable to PT session. Pt required increased time with all activity. C/O 4/10 pain at rest that elevated to 6/10 after ambulation. Pt exit R side of bed with min A and was able to stand and ambulate with CGA + gait belt. Ambulated to doorway and return with very slow step to gait pattern. He was able to tolerate performing there ex handout and demonstrated AROM -5 to 87 degrees after performing stretching.MD entered room at conclusion of session. Pt was seated up in recliner with polar care re-applied, towel roll under LE to promote knee ext, chair alarm applied and call bell in reach. Pt tolerated session well and is progressing. Will advance gait distances in afternoon treatment. PT continues to recommend home with HHPT to address deficits and promote return to PLOF.     Follow Up Recommendations  Home health PT;Supervision for mobility/OOB     Equipment Recommendations  Other (comment)(pt has personal RM in room)    Recommendations for Other Services       Precautions / Restrictions Precautions Precautions: Fall;Knee Precaution Booklet Issued: Yes (comment) Restrictions Weight Bearing Restrictions: Yes RLE Weight Bearing: Weight bearing as tolerated    Mobility  Bed Mobility Overal bed mobility: Needs Assistance Bed Mobility: Supine to Sit     Supine to sit: HOB elevated;Min assist     General bed mobility comments: Min assist supporting RLE  Transfers Overall transfer level: Needs assistance Equipment used: Rolling walker (2 wheeled) Transfers: Sit to/from Stand Sit  to Stand: Min guard;From elevated surface         General transfer comment: Pt was able to stand EOB 2 x with CGA for safety. Vcs for handplacement and increased fwd wt shift.  Ambulation/Gait Ambulation/Gait assistance: Min Gaffer (Feet): 30 Feet Assistive device: Rolling walker (2 wheeled) Gait Pattern/deviations: Antalgic;Step-to pattern Gait velocity: decreased   General Gait Details: Pt ambulated to room doorway prior to requesting to turn aroun and ambulate back to recliner. Pain increased to 6-7/10 with gait. He demonstrated no LOB or unsteadiness however has antalgic step to pattern.   Stairs             Wheelchair Mobility    Modified Rankin (Stroke Patients Only)       Balance                                            Cognition Arousal/Alertness: Awake/alert Behavior During Therapy: WFL for tasks assessed/performed Overall Cognitive Status: Within Functional Limits for tasks assessed                                 General Comments: Pt is A and O x 4. Cooperative but talkative and needs constant re-focusing      Exercises Total Joint Exercises Ankle Circles/Pumps: AROM;Both;10 reps Quad Sets: AROM;10 reps;Strengthening;Right Short Arc Quad: AROM;Strengthening;Right;10 reps  Heel Slides: AAROM;Strengthening;Right;10 reps Hip ABduction/ADduction: AAROM;Right;10 reps Straight Leg Raises: AAROM;Right;10 reps Long Arc Quad: Right;10 reps;AAROM Knee Flexion: AAROM;Right;5 reps;Seated Goniometric ROM: -5 to 87degrees     General Comments        Pertinent Vitals/Pain Pain Assessment: 0-10 Pain Score: 4  Pain Location: R knee Pain Descriptors / Indicators: Aching;Grimacing;Sore Pain Intervention(s): Limited activity within patient's tolerance;Monitored during session;Premedicated before session;Ice applied    Home Living                      Prior Function            PT Goals  (current goals can now be found in the care plan section) Acute Rehab PT Goals Patient Stated Goal: to go home Progress towards PT goals: Progressing toward goals    Frequency    BID      PT Plan Current plan remains appropriate    Co-evaluation              AM-PAC PT "6 Clicks" Mobility   Outcome Measure  Help needed turning from your back to your side while in a flat bed without using bedrails?: A Little Help needed moving from lying on your back to sitting on the side of a flat bed without using bedrails?: A Little Help needed moving to and from a bed to a chair (including a wheelchair)?: A Little Help needed standing up from a chair using your arms (e.g., wheelchair or bedside chair)?: A Little Help needed to walk in hospital room?: A Little Help needed climbing 3-5 steps with a railing? : A Little 6 Click Score: 18    End of Session Equipment Utilized During Treatment: Gait belt Activity Tolerance: Patient tolerated treatment well Patient left: in chair;with chair alarm set;with call bell/phone within reach;with family/visitor present;with SCD's reapplied Nurse Communication: Mobility status PT Visit Diagnosis: Other abnormalities of gait and mobility (R26.89);Muscle weakness (generalized) (M62.81);Difficulty in walking, not elsewhere classified (R26.2);Pain Pain - Right/Left: Right Pain - part of body: Knee     Time: [id number] PT Time Calculation (min) (ACUTE ONLY): 41 min  Charges:  $Gait Training: 8-22 mins $Therapeutic Exercise: 23-37 mins                     Julaine Fusi PTA 04/03/19, 10:57 AM

## 2019-04-03 NOTE — Progress Notes (Signed)
Physical Therapy Treatment Patient Details Name: John Peterson MRN: [id number] DOB: 08-Oct-1945 Today's Date: 04/03/2019    History of Present Illness Pt is 74 yo male s/p R TKA, WBAT. PMH of smoking, HTN, PVD, GERD, HLD, cancer (CTCL), L TKA. Pt exhibited mild UE tremor, reported it has been present since his twenties.    PT Comments    Pt is seated in recliner upon therapist arriving with spouse at bedside. A and O x 4. Reports 3/10 pain.  He agrees to PT session and requested to go to [facility] for BM. Unsuccessful with BM. Pt able to stand from recliner and elevated toilet seat with CGA for safety.He was able to tolerate increased ambulation this afternoon and demonstrated improved gait kinematics from earlier. Progressed to step through gait pattern and ambulated ~ 80 ft. Unwilling to ambulate further distance but will advance as able tomorrow. After returning to room and repositioning in bed. Therapist reviewed and educated pt/pt's spouse on proper ther ex performance to improve ROM and strength. Pt demonstrated improved strength with less cues to perform correctly. Both spouse and patient feel confident in ability to perform without assistance. Bed alarm placed, polar care applied, call bell in reach and spouse at bedside post PT session. Will continue to follow per POC and progress as able. Pt will benefit from HHPT to address deficits and assist pt with returning to PLOF.     Follow Up Recommendations  Home health PT     Equipment Recommendations  None recommended by PT    Recommendations for Other Services       Precautions / Restrictions Precautions Precautions: Fall;Knee Precaution Booklet Issued: Yes (comment) Restrictions Weight Bearing Restrictions: Yes RLE Weight Bearing: Weight bearing as tolerated    Mobility  Bed Mobility Overal bed mobility: Needs Assistance Bed Mobility: Sit to Supine       Sit to supine: Min assist   General bed mobility comments: Pt required  min assist supporting R LE  Transfers Overall transfer level: Needs assistance Equipment used: Rolling walker (2 wheeled) Transfers: Sit to/from Stand Sit to Stand: Min guard         General transfer comment: Pt was able to stand from recliner and elevated toilet seat with CGA. Vcs for proper handplacment.  Ambulation/Gait Ambulation/Gait assistance: Supervision Gait Distance (Feet): 80 Feet Assistive device: Rolling walker (2 wheeled) Gait Pattern/deviations: Step-to pattern;Step-through pattern Gait velocity: decreased   General Gait Details: Pt demonstrated advancement in gait kinematics and was able to progress from step to pattern to step through.   Stairs             Wheelchair Mobility    Modified Rankin (Stroke Patients Only)       Balance                                            Cognition Arousal/Alertness: Awake/alert Behavior During Therapy: WFL for tasks assessed/performed Overall Cognitive Status: Within Functional Limits for tasks assessed                                 General Comments: Pt is A and O x 4. Cooperative and pleasant. spouse present throughout session.      Exercises Total Joint Exercises Ankle Circles/Pumps: AROM;Both;20 reps Quad Sets: AROM;10 reps;Strengthening;Right Short Arc Quad: AROM;Strengthening;Right;10  reps Heel Slides: AROM;Strengthening;Right;10 reps Hip ABduction/ADduction: AROM;Right;10 reps;Supine Straight Leg Raises: AAROM;Right;10 reps    General Comments        Pertinent Vitals/Pain Pain Assessment: 0-10 Pain Score: 3  Pain Location: R knee Pain Descriptors / Indicators: Aching;Grimacing;Sore Pain Intervention(s): Limited activity within patient's tolerance;Monitored during session;Premedicated before session;Ice applied    Home Living                      Prior Function            PT Goals (current goals can now be found in the care plan section)  Acute Rehab PT Goals Patient Stated Goal: to go home Progress towards PT goals: Progressing toward goals    Frequency    BID      PT Plan Current plan remains appropriate    Co-evaluation              AM-PAC PT "6 Clicks" Mobility   Outcome Measure  Help needed turning from your back to your side while in a flat bed without using bedrails?: A Little Help needed moving from lying on your back to sitting on the side of a flat bed without using bedrails?: A Little Help needed moving to and from a bed to a chair (including a wheelchair)?: A Little Help needed standing up from a chair using your arms (e.g., wheelchair or bedside chair)?: A Little Help needed to walk in hospital room?: A Little Help needed climbing 3-5 steps with a railing? : A Little 6 Click Score: 18    End of Session Equipment Utilized During Treatment: Gait belt Activity Tolerance: Patient tolerated treatment well Patient left: in bed;with call bell/phone within reach;with bed alarm set;with family/visitor present;with SCD's reapplied Nurse Communication: Mobility status PT Visit Diagnosis: Other abnormalities of gait and mobility (R26.89);Muscle weakness (generalized) (M62.81);Difficulty in walking, not elsewhere classified (R26.2);Pain Pain - Right/Left: Right Pain - part of body: Knee     Time: [id number] PT Time Calculation (min) (ACUTE ONLY): 46 min  Charges:  $Gait Training: 23-37 mins $Therapeutic Exercise: 8-22 mins                     Julaine Fusi PTA 04/03/19, 3:23 PM

## 2019-04-04 LAB — BASIC METABOLIC PANEL
Anion gap: 10 (ref 5–15)
BUN: 11 mg/dL (ref 8–23)
CO2: 25 mmol/L (ref 22–32)
Calcium: 8.7 mg/dL — ABNORMAL LOW (ref 8.9–10.3)
Chloride: 101 mmol/L (ref 98–111)
Creatinine, Ser: 0.71 mg/dL (ref 0.61–1.24)
GFR calc Af Amer: >60 mL/min (ref >60)
GFR calc non Af Amer: >60 mL/min (ref >60)
Glucose, Bld: 132 mg/dL — ABNORMAL HIGH (ref 70–99)
Potassium: 3.5 mmol/L (ref 3.5–5.1)
Sodium: 136 mmol/L (ref 135–145)

## 2019-04-04 LAB — CBC
HCT: 37.9 % — ABNORMAL LOW (ref 39.0–52.0)
Hemoglobin: 13.1 g/dL (ref 13.0–17.0)
MCH: 31.8 pg (ref 26.0–34.0)
MCHC: 34.6 g/dL (ref 30.0–36.0)
MCV: 92 fL (ref 80.0–100.0)
Platelets: 155 10*3/uL (ref 150–400)
RBC: 4.12 MIL/uL — ABNORMAL LOW (ref 4.22–5.81)
RDW: 12.7 % (ref 11.5–15.5)
WBC: 11.9 10*3/uL — ABNORMAL HIGH (ref 4.0–10.5)
nRBC: 0 % (ref 0.0–0.2)

## 2019-04-04 MED ORDER — OXYCODONE HCL 5 MG PO TABS: 5.0000 mg | ORAL_TABLET | ORAL | 0 refills | Status: DC | PRN

## 2019-04-04 MED ORDER — APIXABAN 2.5 MG PO TABS: 2.5000 mg | ORAL_TABLET | Freq: Two times a day (BID) | ORAL | 0 refills | Status: DC

## 2019-04-04 MED ORDER — TRAMADOL HCL 50 MG PO TABS: 50.0000 mg | ORAL_TABLET | Freq: Four times a day (QID) | ORAL | 0 refills | Status: DC | PRN

## 2019-04-04 NOTE — Discharge Instructions (Signed)

## 2019-04-04 NOTE — Progress Notes (Signed)
RN discussed discharge paperwork and medications with pt and wife. Both pt and wife verbalized understanding.

## 2019-04-04 NOTE — Discharge Summary (Signed)
Physician Discharge Summary  Patient ID: John Peterson MRN: [id number] DOB/AGE: 1945/11/12 74 y.o.  Admit date: 04/02/2019 Discharge date: 04/04/2019  Admission Diagnoses:  Status post total knee replacement using cement, right [Z96.651]  Discharge Diagnoses: Patient Active Problem List   Diagnosis Date Noted  . Status post total knee replacement using cement, right 04/02/2019  . Essential hypertension, benign 06/07/2016  . Hyperlipidemia 06/07/2016  . Carotid stenosis 06/07/2016    Past Medical History:  Diagnosis Date  . Allergy   . Arthritis   . Cancer ([place])    CTCL  . GERD (gastroesophageal reflux disease)   . Hyperlipidemia   . Hypertension   . Peripheral vascular disease ([place])      Transfusion: None.   Consultants (if any):   Discharged Condition: Improved  Hospital Course: John Peterson is an 74 y.o. male who was admitted 04/02/2019 with a diagnosis of right knee osteoarthritis and went to the operating room on 04/02/2019 and underwent the above named procedures.    Surgeries: Procedure(s): TOTAL KNEE ARTHROPLASTY on 04/02/2019 Patient tolerated the surgery well. Taken to PACU where she was stabilized and then transferred to the orthopedic floor.  Started on Eliquis 2.5mg  twice daily. Foot pumps applied bilaterally at 80 mm. Heels elevated on bed with rolled towels. No evidence of DVT. Negative [place]. Physical therapy started on day #1 for gait training and transfer. OT started day #1 for ADL and assisted devices.  Patient's IV was removed on POD2 and Foley removed on POD1.  Implants: Right TKA using all-cemented [employer] Vanguard system with a 70 mm PCR femur, a 79 mm tibial tray with a 12 mm anterior stabilized E-poly insert, and a 34 x 8.5 mm all-poly 3-pegged domed patella.  He was given perioperative antibiotics:  Anti-infectives (From admission, onward)   Start     Dose/Rate Route Frequency Ordered Stop   04/02/19 1130  ceFAZolin (ANCEF) IVPB 2g/100  mL premix     2 g 200 mL/hr over 30 Minutes Intravenous Every 6 hours 04/02/19 1121 04/03/19 0528   04/02/19 0625  ceFAZolin (ANCEF) 2-4 GM/100ML-% IVPB    Note to Pharmacy: John Peterson   : cabinet override      04/02/19 0625 04/02/19 0741   04/02/19 0600  ceFAZolin (ANCEF) IVPB 2g/100 mL premix     2 g 200 mL/hr over 30 Minutes Intravenous On call to O.R. 04/01/19 2327 04/02/19 0741    .  He was given sequential compression devices, early ambulation, and Eliquis for DVT prophylaxis.  He benefited maximally from the hospital stay and there were no complications.    Recent vital signs:  Vitals:   04/04/19 0003 04/04/19 0822  BP: (!) 144/81 131/69  Pulse: (!) 106 100  Resp: 20 18  Temp: 98.6 F (37 C) 99 F (37.2 C)  SpO2: 92% 95%    Recent laboratory studies:  Lab Results  Component Value Date   HGB 13.1 04/04/2019   HGB 11.9 (L) 04/03/2019   HGB 14.8 01/14/2019   Lab Results  Component Value Date   WBC 11.9 (H) 04/04/2019   PLT 155 04/04/2019   No results found for: INR Lab Results  Component Value Date   NA 136 04/04/2019   K 3.5 04/04/2019   CL 101 04/04/2019   CO2 25 04/04/2019   BUN 11 04/04/2019   CREATININE 0.71 04/04/2019   GLUCOSE 132 (H) 04/04/2019    Discharge Medications:   Allergies as of 04/04/2019  Reactions   Tramadol Other (See Comments)   PATIENT PREFERS TO AVOID ALL OPOIDS      Medication List    STOP taking these medications   aspirin EC 81 MG tablet   celecoxib 200 MG capsule Commonly known as: CELEBREX     TAKE these medications   acetaminophen 500 MG tablet Commonly known as: TYLENOL Take 1,000 mg by mouth daily.   allopurinol 100 MG tablet Commonly known as: ZYLOPRIM Take 100 mg by mouth daily.   amLODipine 10 MG tablet Commonly known as: NORVASC Take 10 mg by mouth daily.   amoxicillin 500 MG capsule Commonly known as: AMOXIL Take 1,000 mg by mouth See admin instructions. TAKE 2 CAPSULES (1000 MG) BY  MOUTH 1 HOUR PRIOR TO DENTAL APPOINTMENTS   apixaban 2.5 MG Tabs tablet Commonly known as: ELIQUIS Take 1 tablet (2.5 mg total) by mouth 2 (two) times daily for 12 days.   atorvastatin 10 MG tablet Commonly known as: LIPITOR Take 10 mg by mouth daily.   azelastine 0.05 % ophthalmic solution Commonly known as: OPTIVAR Place 1 drop into both eyes 2 (two) times daily as needed (dry eyes.).   cyclobenzaprine 10 MG tablet Commonly known as: FLEXERIL Take 10 mg by mouth 3 (three) times daily as needed for muscle spasms.   desonide 0.05 % cream Commonly known as: DESOWEN Apply 1 application topically 2 (two) times daily as needed (cancer lesions).   famotidine 20 MG tablet Commonly known as: PEPCID Take 20 mg by mouth at bedtime.   fluticasone 50 MCG/ACT nasal spray Commonly known as: FLONASE Place 1 spray into both nostrils daily.   ketoconazole 2 % cream Commonly known as: NIZORAL Apply 1 application topically daily. APPLIED TO FEET   lisinopril 40 MG tablet Commonly known as: ZESTRIL Take 40 mg by mouth daily.   loratadine 10 MG tablet Commonly known as: CLARITIN Take 10 mg by mouth daily.   multivitamin with minerals tablet Take 1 tablet by mouth daily.   oxyCODONE 5 MG immediate release tablet Commonly known as: Oxy IR/ROXICODONE Take 1-2 tablets (5-10 mg total) by mouth every 4 (four) hours as needed for moderate pain (pain score 4-6).   ROLAIDS PO Take 1 tablet by mouth at bedtime.   tamsulosin 0.4 MG Caps capsule Commonly known as: FLOMAX Take 0.4 mg by mouth daily after supper.   traMADol 50 MG tablet Commonly known as: Ultram Take 1 tablet (50 mg total) by mouth every 6 (six) hours as needed.            Durable Medical Equipment  (From admission, onward)         Start     Ordered   04/02/19 1122  DME Bedside commode  Once    Question:  Patient needs a bedside commode to treat with the following condition  Answer:  Status post total knee  replacement using cement, right   04/02/19 1121   04/02/19 1122  DME 3 n 1  Once     04/02/19 1121   04/02/19 1122  DME Walker rolling  Once    Question Answer Comment  Walker: With 5 Inch Wheels   Patient needs a walker to treat with the following condition Status post total knee replacement using cement, right      04/02/19 1121          Diagnostic Studies: DG Knee Right Port  Result Date: 04/02/2019 CLINICAL DATA:  Right knee arthroplasty EXAM: PORTABLE RIGHT KNEE -  1-2 VIEW COMPARISON:  07/31/2015 FINDINGS: Interval postsurgical changes status post right total knee arthroplasty. Arthroplasty components are in their expected alignment without periprosthetic lucency or fracture. Air and fluid noted within the joint space. Expected postoperative changes within the overlying soft tissues. IMPRESSION: Interval postsurgical changes status post right total knee arthroplasty. Right total knee arthroplasty without evidence of immediate postoperative complication. Electronically Signed   By: Davina Poke D.O.   On: 04/02/2019 11:44   Disposition: Plan for discharge home this afternoon.  Follow-up Information    Lattie Corns, PA-C Follow up in 14 day(s).   Specialty: Physician Assistant Why: Electa Sniff information: [place] [address] [phone]          Signed: Judson Roch PA-C 04/04/2019, 11:35 AM

## 2019-04-04 NOTE — Plan of Care (Signed)
Adequate for discharge.

## 2019-04-04 NOTE — TOC Transition Note (Signed)
Transition of Care [facility]) - CM/SW Discharge Note   Patient Details  Name: John Peterson MRN: [id number] Date of Birth: 16-Jan-1946  [facility] CM/SW Contact:  Elease Hashimoto, LCSW Phone Number: 04/04/2019, 9:00 AM   Clinical Narrative: MD feels pt is ready for discharge today. Pt feels ready along with his wife. RW in room and pt is set up with [facility] for follow up therapies. No further needs. Pt doing well with RW and feels he and wife can manage at home.      Final next level of care: [place] Barriers to Discharge: Barriers Resolved   Patient Goals and CMS Choice Patient states their goals for this hospitalization and ongoing recovery are:: Hope I do well and can go home in the next day or two      Discharge Placement                Patient to be transferred to facility by: Wife to transport home Name of family member notified: Wife Patient and family notified of of transfer: 04/04/19  Discharge Plan and Services In-house Referral: Clinical Social Work   Post Acute Care Choice: Home Health          DME Arranged: Gilford Rile rolling DME Agency: [employer] Date DME Agency Contacted: 04/02/19 Time DME Agency Contacted: [id number] Representative spoke with at DME Agency: [place]: RN, PT [place]: [facility] Date [place]: 04/02/19 Time [place]: 1431 Representative spoke with at [place]: teresa  Social Determinants of Health ([place]) Interventions     Readmission Risk Interventions No flowsheet data found.

## 2019-04-04 NOTE — Progress Notes (Signed)
Subjective: 2 Days Post-Op Procedure(s) (LRB): TOTAL KNEE ARTHROPLASTY (Right) Patient reports pain as mild.   Patient is well, and has had no acute complaints or problems Plan is to go Home after hospital stay. Negative for chest pain and shortness of breath Fever: Mild temp last night. Gastrointestinal:Negative for nausea and vomiting.  Objective: Vital signs in last 24 hours: Temp:  [98.6 F (37 C)-99.7 F (37.6 C)] 99 F (37.2 C) (02/25 0822) Pulse Rate:  [85-106] 100 (02/25 0822) Resp:  [17-20] 18 (02/25 0822) BP: (131-144)/(66-81) 131/69 (02/25 0822) SpO2:  [92 %-96 %] 95 % (02/25 0822)  Intake/Output from previous day:  Intake/Output Summary (Last 24 hours) at 04/04/2019 1130 Last data filed at 04/04/2019 0400 Gross per 24 hour  Intake 240 ml  Output 1625 ml  Net -1385 ml    Intake/Output this shift: No intake/output data recorded.  Labs: Recent Labs    04/03/19 0303 04/04/19 0257  HGB 11.9* 13.1   Recent Labs    04/03/19 0303 04/04/19 0257  WBC 8.5 11.9*  RBC 3.75* 4.12*  HCT 35.7* 37.9*  PLT 160 155   Recent Labs    04/03/19 0303 04/04/19 0257  NA 136 136  K 3.5 3.5  CL 103 101  CO2 28 25  BUN 14 11  CREATININE 0.87 0.71  GLUCOSE 116* 132*  CALCIUM 8.5* 8.7*   No results for input(s): LABPT, INR in the last 72 hours.   EXAM General - Patient is Alert, Appropriate and Oriented Extremity - ABD soft Neurovascular intact Sensation intact distally Intact pulses distally Dorsiflexion/Plantar flexion intact Incision: dressing C/D/I No cellulitis present Dressing/Incision - clean, dry, no drainage Motor Function - intact, moving foot and toes well on exam.   Past Medical History:  Diagnosis Date  . Allergy   . Arthritis   . Cancer ([place])    CTCL  . GERD (gastroesophageal reflux disease)   . Hyperlipidemia   . Hypertension   . Peripheral vascular disease ([facility])     Assessment/Plan: 2 Days Post-Op Procedure(s) (LRB): TOTAL KNEE  ARTHROPLASTY (Right) Active Problems:   Status post total knee replacement using cement, right  Estimated body mass index is 33.24 kg/m as calculated from the following:   Height as of this encounter: 5' 10.5" (1.791 m).   Weight as of this encounter: 106.6 kg. Discharge home with home health   Labs reviewed this morning, Hg 13.1 Temp 99 last night, WBC 11.9.  No SOB, chest pain or urinary symptoms. Encouraged incentive spirometer. Continue to work on a BM, patient is passing gas without pain. Plan for possible d/c home today.  DVT Prophylaxis - Lovenox and Foot Pumps Weight-Bearing as tolerated to right leg  J. Cameron Proud, PA-C [facility] Orthopaedic Surgery 04/04/2019, 11:30 AM

## 2019-04-04 NOTE — Progress Notes (Signed)
Physical Therapy Treatment Patient Details Name: John Peterson MRN: [id number] DOB: 1945/11/20 Today's Date: 04/04/2019    History of Present Illness Pt is 74 yo male s/p R TKA, WBAT. PMH of smoking, HTN, PVD, GERD, HLD, cancer (CTCL), L TKA. Pt exhibited mild UE tremor, reported it has been present since his twenties.    PT Comments    Pt was seated up in recliner upon therapist arriving.Agreeable to PT session and is cooperative throughout. Pt is A and O x 4. Reports 3/10 pain that increased to 6/10 with wt bearing. Pt demonstrated safe ability to sit to stand from recliner 2 x with supervision only. Ambulated 150 ft with RW + gait belt for safety. No LOB or unsteadiness noted. O2 > 90 % throughout. Pt returned to room after gait training and was able to perform and tolerate there ex handout (exercises list below). AROM -4 degrees ext to 88 degrees flexion. He tolerated session well and is progressing with PT. Recommend Home with HHPT at D/C.     Follow Up Recommendations  Home health PT     Equipment Recommendations  None recommended by PT    Recommendations for Other Services       Precautions / Restrictions Precautions Precautions: Fall;Knee Precaution Booklet Issued: Yes (comment) Restrictions Weight Bearing Restrictions: Yes RLE Weight Bearing: Weight bearing as tolerated    Mobility  Bed Mobility               General bed mobility comments: Pt was in recliner pre/post session. He reports he was able to exit bed earlier without assistance  Transfers Overall transfer level: Needs assistance Equipment used: Rolling walker (2 wheeled) Transfers: Sit to/from Stand Sit to Stand: Supervision         General transfer comment: Pt was able to perform STS to/from recliner > RW with supervision only. tolerated transfers well.  Ambulation/Gait Ambulation/Gait assistance: Supervision Gait Distance (Feet): 150 Feet Assistive device: Rolling walker (2 wheeled) Gait  Pattern/deviations: Step-through pattern Gait velocity: decreased   General Gait Details: Pt was able to ambulate increased distance this date without LOB or unsteadiness. He demonstrated improved step through gait pattern with less pain. he did rate increase pain to 6/10 with ambulation.   Stairs             Wheelchair Mobility    Modified Rankin (Stroke Patients Only)       Balance                                            Cognition Arousal/Alertness: Awake/alert Behavior During Therapy: WFL for tasks assessed/performed Overall Cognitive Status: Within Functional Limits for tasks assessed                                 General Comments: Pt is A and O x 4. pleasant and cooperative. States he is feeling much better today      Exercises Total Joint Exercises Ankle Circles/Pumps: AROM;Both;20 reps Quad Sets: AROM;10 reps;Strengthening;Right Short Arc Quad: AROM;Strengthening;Right;10 reps Heel Slides: AROM;Strengthening;Right;10 reps Hip ABduction/ADduction: AROM;Right;10 reps;Supine Straight Leg Raises: AAROM;Right;10 reps    General Comments        Pertinent Vitals/Pain Pain Assessment: 0-10 Pain Score: 3  Pain Location: R knee Pain Descriptors / Indicators: Aching;Grimacing;Sore Pain Intervention(s): Limited activity within patient's  tolerance;Premedicated before session;Monitored during session;Ice applied    Home Living                      Prior Function            PT Goals (current goals can now be found in the care plan section) Acute Rehab PT Goals Patient Stated Goal: to go home Progress towards PT goals: Progressing toward goals    Frequency    BID      PT Plan Current plan remains appropriate    Co-evaluation              AM-PAC PT "6 Clicks" Mobility   Outcome Measure  Help needed turning from your back to your side while in a flat bed without using bedrails?: A Little Help  needed moving from lying on your back to sitting on the side of a flat bed without using bedrails?: A Little Help needed moving to and from a bed to a chair (including a wheelchair)?: A Little Help needed standing up from a chair using your arms (e.g., wheelchair or bedside chair)?: A Little Help needed to walk in hospital room?: A Little Help needed climbing 3-5 steps with a railing? : A Little 6 Click Score: 18    End of Session Equipment Utilized During Treatment: Gait belt Activity Tolerance: Patient tolerated treatment well Patient left: in chair;with call bell/phone within reach;with chair alarm set Nurse Communication: Mobility status PT Visit Diagnosis: Other abnormalities of gait and mobility (R26.89);Muscle weakness (generalized) (M62.81);Difficulty in walking, not elsewhere classified (R26.2);Pain Pain - Right/Left: Right Pain - part of body: Knee     Time: 0950-1015 PT Time Calculation (min) (ACUTE ONLY): 25 min  Charges:  $Gait Training: 8-22 mins $Therapeutic Exercise: 8-22 mins                    Julaine Fusi PTA 04/04/19, 10:29 AM

## 2019-06-04 ENCOUNTER — Other Ambulatory Visit (INDEPENDENT_AMBULATORY_CARE_PROVIDER_SITE_OTHER): Payer: Self-pay | Admitting: Vascular Surgery

## 2019-06-04 DIAGNOSIS — I6529 Occlusion and stenosis of unspecified carotid artery: Secondary | ICD-10-CM

## 2019-06-11 ENCOUNTER — Ambulatory Visit (INDEPENDENT_AMBULATORY_CARE_PROVIDER_SITE_OTHER): Payer: Medicare PPO | Admitting: Vascular Surgery

## 2019-06-11 ENCOUNTER — Encounter (INDEPENDENT_AMBULATORY_CARE_PROVIDER_SITE_OTHER): Payer: Self-pay | Admitting: Vascular Surgery

## 2019-06-11 ENCOUNTER — Ambulatory Visit (INDEPENDENT_AMBULATORY_CARE_PROVIDER_SITE_OTHER): Payer: Medicare PPO

## 2019-06-11 ENCOUNTER — Other Ambulatory Visit: Payer: Self-pay

## 2019-06-11 VITALS — BP 127/78 | HR 79 | Ht 70.0 in | Wt 237.0 lb

## 2019-06-11 DIAGNOSIS — I10 Essential (primary) hypertension: Secondary | ICD-10-CM | POA: Diagnosis not present

## 2019-06-11 DIAGNOSIS — E785 Hyperlipidemia, unspecified: Secondary | ICD-10-CM

## 2019-06-11 DIAGNOSIS — M7989 Other specified soft tissue disorders: Secondary | ICD-10-CM

## 2019-06-11 DIAGNOSIS — I6529 Occlusion and stenosis of unspecified carotid artery: Secondary | ICD-10-CM

## 2019-06-11 DIAGNOSIS — I6521 Occlusion and stenosis of right carotid artery: Secondary | ICD-10-CM

## 2019-06-11 NOTE — Assessment & Plan Note (Signed)
Carotid duplex today reveals no hemodynamically significant carotid stenosis on either side with no progression from his previous study 2 years ago.  No change in his medical regimen which includes Eliquis and Lipitor.  Plan to recheck in 2 years.

## 2019-06-11 NOTE — Assessment & Plan Note (Signed)
Controlled reasonably well with compression stockings.  Worsened after his recent knee replacement, but this is almost back to baseline now 10 weeks postoperatively.

## 2019-06-11 NOTE — Progress Notes (Signed)
MRN : [id number]  John Peterson is a 74 y.o. ([birth year]) male who presents with chief complaint of  Chief Complaint  Patient presents with  . Follow-up    U/S Follow up  .  History of Present Illness: Patient returns in follow-up of his carotid disease.  He is 10 weeks status post right knee replacement.  His leg swelling is almost returned to his baseline which is quite mild.  He continues to wear compression stockings and is doing well with this.  No focal neurologic symptoms. Specifically, the patient denies amaurosis fugax, speech or swallowing difficulties, or arm or leg weakness or numbness. Carotid duplex today reveals no hemodynamically significant carotid stenosis on either side with no progression from his previous study 2 years ago.  Current Outpatient Medications  Medication Sig Dispense Refill  . acetaminophen (TYLENOL) 500 MG tablet Take 1,000 mg by mouth daily.    Marland Kitchen allopurinol (ZYLOPRIM) 100 MG tablet Take 100 mg by mouth daily.  3  . amLODipine (NORVASC) 10 MG tablet Take 10 mg by mouth daily.     Marland Kitchen amoxicillin (AMOXIL) 500 MG capsule Take 1,000 mg by mouth See admin instructions. TAKE 2 CAPSULES (1000 MG) BY MOUTH 1 HOUR PRIOR TO DENTAL APPOINTMENTS    . atorvastatin (LIPITOR) 10 MG tablet Take 10 mg by mouth daily.     . Ca Carbonate-Mag Hydroxide (ROLAIDS PO) Take 1 tablet by mouth at bedtime.    . celecoxib (CELEBREX) 200 MG capsule Take by mouth.    . desonide (DESOWEN) 0.05 % cream Apply 1 application topically 2 (two) times daily as needed (cancer lesions).   12  . famotidine (PEPCID) 20 MG tablet Take 20 mg by mouth at bedtime.     . fluticasone (FLONASE) 50 MCG/ACT nasal spray Place 1 spray into both nostrils daily.    Marland Kitchen ketoconazole (NIZORAL) 2 % cream Apply 1 application topically daily. APPLIED TO FEET    . lisinopril (PRINIVIL,ZESTRIL) 40 MG tablet Take 40 mg by mouth daily.  1  . loratadine (CLARITIN) 10 MG tablet Take 10 mg by mouth daily.     .  Multiple Vitamins-Minerals (MULTIVITAMIN WITH MINERALS) tablet Take 1 tablet by mouth daily.     Marland Kitchen oxyCODONE (OXY IR/ROXICODONE) 5 MG immediate release tablet Take 1-2 tablets (5-10 mg total) by mouth every 4 (four) hours as needed for moderate pain (pain score 4-6). 60 tablet 0  . tamsulosin (FLOMAX) 0.4 MG CAPS capsule Take 0.4 mg by mouth daily after supper.   11  . traMADol (ULTRAM) 50 MG tablet Take 1 tablet (50 mg total) by mouth every 6 (six) hours as needed. 30 tablet 0  . apixaban (ELIQUIS) 2.5 MG TABS tablet Take 1 tablet (2.5 mg total) by mouth 2 (two) times daily for 12 days. 24 tablet 0  . azelastine (OPTIVAR) 0.05 % ophthalmic solution Place 1 drop into both eyes 2 (two) times daily as needed (dry eyes.).    Marland Kitchen cyclobenzaprine (FLEXERIL) 10 MG tablet Take 10 mg by mouth 3 (three) times daily as needed for muscle spasms.     No current facility-administered medications for this visit.    Past Medical History:  Diagnosis Date  . Allergy   . Arthritis   . Cancer ([place])    CTCL  . GERD (gastroesophageal reflux disease)   . Hyperlipidemia   . Hypertension   . Peripheral vascular disease [facility])     Past Surgical History:  Procedure Laterality Date  .  HYDROCELE EXCISION  11/1998  . JOINT REPLACEMENT Left 03/06/2013   L Knee Medial Compartment Makoplasty  . JOINT REPLACEMENT Left 03/02/2016   L Knee Unicondylar converted to Total Replacement [place]  . MOLE REMOVAL  07/2017  . TOTAL KNEE ARTHROPLASTY Right 04/02/2019   Procedure: TOTAL KNEE ARTHROPLASTY;  Surgeon: Corky Mull, MD;  Location: [facility] ORS;  Service: Orthopedics;  Laterality: Right;     Social History   Tobacco Use  . Smoking status: Former Smoker    Quit date: 1968    Years since quitting: 53.3  . Smokeless tobacco: Never Used  Substance Use Topics  . Alcohol use: Yes    Alcohol/week: 7.0 standard drinks    Types: 7 Cans of beer per week  . Drug use: No     Family History No bleeding or clotting  disorders   Allergies  Allergen Reactions  . Tramadol Other (See Comments)    PATIENT PREFERS TO AVOID ALL OPOIDS   REVIEW OF SYSTEMS(Negative unless checked)  Constitutional: [] ?Weight loss[] ?Fever[] ?Chills Cardiac:[] ?Chest pain[] ?Chest pressure[] ?Palpitations [] ?Shortness of breath when laying flat [] ?Shortness of breath at rest [] ?Shortness of breath with exertion. Vascular: [] ?Pain in legs with walking[] ?Pain in legsat rest[] ?Pain in legs when laying flat [] ?Claudication [] ?Pain in feet when walking [] ?Pain in feet at rest [] ?Pain in feet when laying flat [] ?History of DVT [] ?Phlebitis [x] ?Swelling in legs [] ?Varicose veins [] ?Non-healing ulcers Pulmonary: [] ?Uses home oxygen [] ?Productive cough[] ?Hemoptysis [] ?Wheeze [] ?COPD [] ?Asthma Neurologic: [] ?Dizziness [] ?Blackouts [] ?Seizures [] ?History of stroke [] ?History of TIA[] ?Aphasia [] ?Temporary blindness[] ?Dysphagia [] ?Weaknessor numbness in arms [] ?Weakness or numbnessin legs Musculoskeletal: [x] ?Arthritis [] ?Joint swelling [x] ?Joint pain [] ?Low back pain Hematologic:[] ?Easy bruising[] ?Easy bleeding [] ?Hypercoagulable state [] ?Anemic [] ?Hepatitis Gastrointestinal:[] ?Blood in stool[] ?Vomiting blood[] ?Gastroesophageal reflux/heartburn[] ?Difficulty swallowing. Genitourinary: [] ?Chronic kidney disease [] ?Difficulturination [] ?Frequenturination [] ?Burning with urination[] ?Blood in urine Skin: [] ?Rashes [] ?Ulcers [] ?Wounds Psychological: [] ?History of anxiety[] ?History of major depression.    Physical Examination  Vitals:   06/11/19 1014  BP: 127/78  Pulse: 79  Weight: 237 lb (107.5 kg)  Height: 5\' 10"  (1.778 m)   Body mass index is 34.01 kg/m. Gen:  WD/WN, NAD Head: [place]/AT, No temporalis wasting. Ear/Nose/Throat: Hearing grossly intact, nares w/o erythema or drainage, trachea midline Eyes: Conjunctiva clear.  Sclera non-icteric Neck: Supple.  No bruit  Pulmonary:  Good air movement, equal and clear to auscultation bilaterally.  Cardiac: RRR, No JVD Vascular:  Vessel Right Left  Radial Palpable Palpable       Musculoskeletal: M/S 5/5 throughout.  No deformity or atrophy.  Trace lower extremity edema. Neurologic: CN 2-12 intact. Sensation grossly intact in extremities.  Symmetrical.  Speech is fluent. Motor exam as listed above. Psychiatric: Judgment intact, Mood & affect appropriate for pt's clinical situation. Dermatologic: No rashes or ulcers noted.  No cellulitis or open wounds.      CBC Lab Results  Component Value Date   WBC 11.9 (H) 04/04/2019   HGB 13.1 04/04/2019   HCT 37.9 (L) 04/04/2019   MCV 92.0 04/04/2019   PLT 155 04/04/2019    BMET    Component Value Date/Time   NA 136 04/04/2019 0257   K 3.5 04/04/2019 0257   CL 101 04/04/2019 0257   CO2 25 04/04/2019 0257   GLUCOSE 132 (H) 04/04/2019 0257   BUN 11 04/04/2019 0257   CREATININE 0.71 04/04/2019 0257   CALCIUM 8.7 (L) 04/04/2019 0257   GFRNONAA >60 04/04/2019 0257   GFRAA >60 04/04/2019 0257   CrCl cannot be calculated (Patient's most recent lab result is older than the maximum 21 days allowed.).  COAG No results found for: INR, PROTIME  Radiology VAS US CAROTID  Result Date: 06/11/2019 Carotid Arterial Duplex Study Indications:       Carotid artery disease. Comparison Study:  06/09/2017 Performing Technologist: Almira Coaster RVS  Examination Guidelines: A complete evaluation includes B-mode imaging, spectral Doppler, color Doppler, and power Doppler as needed of all accessible portions of each vessel. Bilateral testing is considered an integral part of a complete examination. Limited examinations for reoccurring indications may be performed as noted.  Right Carotid Findings: +----------+--------+--------+--------+------------------+--------+           PSV cm/sEDV cm/sStenosisPlaque DescriptionComments  +----------+--------+--------+--------+------------------+--------+ CCA Prox  72      13                                         +----------+--------+--------+--------+------------------+--------+ CCA Mid   68      14                                         +----------+--------+--------+--------+------------------+--------+ CCA Distal65      17                                         +----------+--------+--------+--------+------------------+--------+ ICA Prox  63      14                                         +----------+--------+--------+--------+------------------+--------+ ICA Mid   49      16                                         +----------+--------+--------+--------+------------------+--------+ ICA Distal51      12                                         +----------+--------+--------+--------+------------------+--------+ ECA       81      7                                          +----------+--------+--------+--------+------------------+--------+ +----------+--------+-------+--------+-------------------+           PSV cm/sEDV cmsDescribeArm Pressure (mmHG) +----------+--------+-------+--------+-------------------+ [id number]      0                                  +----------+--------+-------+--------+-------------------+ +---------+--------+--+--------+-+ VertebralPSV cm/s33EDV cm/s5 +---------+--------+--+--------+-+  Left Carotid Findings: +----------+--------+--------+--------+------------------+--------+           PSV cm/sEDV cm/sStenosisPlaque DescriptionComments +----------+--------+--------+--------+------------------+--------+ CCA Prox  97      9                                          +----------+--------+--------+--------+------------------+--------+  CCA Mid   81      16                                         +----------+--------+--------+--------+------------------+--------+ CCA Distal69      14                                          +----------+--------+--------+--------+------------------+--------+ ICA Prox  37      9                                          +----------+--------+--------+--------+------------------+--------+ ICA Mid   53      14                                         +----------+--------+--------+--------+------------------+--------+ ICA Distal63      18                                         +----------+--------+--------+--------+------------------+--------+ ECA       78      9                                          +----------+--------+--------+--------+------------------+--------+ +----------+--------+--------+--------+-------------------+           PSV cm/sEDV cm/sDescribeArm Pressure (mmHG) +----------+--------+--------+--------+-------------------+ [id number]      0                                   +----------+--------+--------+--------+-------------------+ +---------+--------+--+--------+--+ VertebralPSV cm/s55EDV cm/s12 +---------+--------+--+--------+--+   Summary: Right Carotid: There is no evidence of stenosis in the right ICA. Left Carotid: There is no evidence of stenosis in the left ICA. Vertebrals:  Bilateral vertebral arteries demonstrate antegrade flow. Subclavians: Normal flow hemodynamics were seen in bilateral subclavian              arteries. *See table(s) above for measurements and observations.  Electronically signed by Leotis Pain MD on 06/11/2019 at 10:07:38 AM.    Final      Assessment/Plan Essential hypertension, benign blood pressure control important in reducing the progression of atherosclerotic disease. On appropriate oral medications.   Hyperlipidemia lipid control important in reducing the progression of atherosclerotic disease. Continue statin therapy  Swelling of limb Controlled reasonably well with compression stockings.  Worsened after his recent knee replacement, but this is almost back  to baseline now 10 weeks postoperatively.  Carotid stenosis Carotid duplex today reveals no hemodynamically significant carotid stenosis on either side with no progression from his previous study 2 years ago.  No change in his medical regimen which includes Eliquis and Lipitor.  Plan to recheck in 2 years.    Leotis Pain, MD  06/11/2019 10:36 AM    This note was created with Dragon medical transcription system.  Any errors from dictation are purely unintentional

## 2019-07-01 DIAGNOSIS — R29898 Other symptoms and signs involving the musculoskeletal system: Secondary | ICD-10-CM | POA: Insufficient documentation

## 2019-08-01 ENCOUNTER — Other Ambulatory Visit: Payer: Self-pay | Admitting: Dermatology

## 2019-09-09 ENCOUNTER — Ambulatory Visit: Payer: Medicare PPO | Admitting: Dermatology

## 2019-09-09 ENCOUNTER — Other Ambulatory Visit: Payer: Self-pay

## 2019-09-09 DIAGNOSIS — L578 Other skin changes due to chronic exposure to nonionizing radiation: Secondary | ICD-10-CM

## 2019-09-09 DIAGNOSIS — Z1283 Encounter for screening for malignant neoplasm of skin: Secondary | ICD-10-CM | POA: Diagnosis not present

## 2019-09-09 DIAGNOSIS — B351 Tinea unguium: Secondary | ICD-10-CM

## 2019-09-09 DIAGNOSIS — D1800 Hemangioma unspecified site: Secondary | ICD-10-CM

## 2019-09-09 DIAGNOSIS — B353 Tinea pedis: Secondary | ICD-10-CM | POA: Diagnosis not present

## 2019-09-09 DIAGNOSIS — C84A0 Cutaneous T-cell lymphoma, unspecified, unspecified site: Secondary | ICD-10-CM

## 2019-09-09 DIAGNOSIS — D692 Other nonthrombocytopenic purpura: Secondary | ICD-10-CM

## 2019-09-09 DIAGNOSIS — D229 Melanocytic nevi, unspecified: Secondary | ICD-10-CM

## 2019-09-09 DIAGNOSIS — L821 Other seborrheic keratosis: Secondary | ICD-10-CM

## 2019-09-09 DIAGNOSIS — L814 Other melanin hyperpigmentation: Secondary | ICD-10-CM

## 2019-09-09 MED ORDER — TERBINAFINE HCL 250 MG PO TABS: 250.0000 mg | ORAL_TABLET | Freq: Every day | ORAL | 0 refills | Status: DC

## 2019-09-09 MED ORDER — DESONIDE 0.05 % EX CREA: TOPICAL_CREAM | Freq: Every day | CUTANEOUS | 12 refills | Status: DC

## 2019-09-09 MED ORDER — KETOCONAZOLE 2 % EX CREA: 1.0000 "application " | TOPICAL_CREAM | Freq: Every day | CUTANEOUS | 6 refills | Status: DC

## 2019-09-09 NOTE — Progress Notes (Signed)
18/1947  

## 2019-09-09 NOTE — Progress Notes (Signed)
   Follow-Up Visit   Subjective  John Peterson is a 74 y.o. male who presents for the following: Annual Exam (History of CTCL - has one spot on buttocks that seems to be fading right now. He treated with Desonide cream. TBSE today.) and Rash (Toenail fungus - discussed Lamisil treatment in the past and he would like to persue treatment. His PCP has oked to to stop Atorvastatin for 3 months.). The patient presents for Total-Body Skin Exam (TBSE) for skin cancer screening and mole check.  The following portions of the chart were reviewed this encounter and updated as appropriate:  Tobacco  Allergies  Meds  Problems  Med Hx  Surg Hx  Fam Hx     Review of Systems:  No other skin or systemic complaints except as noted in HPI or Assessment and Plan.  Objective  Well appearing patient in no apparent distress; mood and affect are within normal limits.  A full examination was performed including scalp, head, eyes, ears, nose, lips, neck, chest, axillae, abdomen, back, buttocks, bilateral upper extremities, bilateral lower extremities, hands, feet, fingers, toes, fingernails, and toenails. All findings within normal limits unless otherwise noted below.  Objective  Left Foot - Anterior: Scaliness.  Objective  Toenails: Nail dystrophy  Objective  Left Hip (side) - Posterior: Pink patch   Assessment & Plan    Lentigines - Scattered tan macules - Discussed due to sun exposure - Benign, observe - Call for any changes  Seborrheic Keratoses - Stuck-on, waxy, tan-brown papules and plaques  - Discussed benign etiology and prognosis. - Observe - Call for any changes  Melanocytic Nevi - Tan-brown and/or pink-flesh-colored symmetric macules and papules - Benign appearing on exam today - Observation - Call clinic for new or changing moles - Recommend daily use of broad spectrum spf 30+ sunscreen to sun-exposed areas.   Hemangiomas - Red papules - Discussed benign nature -  Observe - Call for any changes  Actinic Damage - diffuse scaly erythematous macules with underlying dyspigmentation - Recommend daily broad spectrum sunscreen SPF 30+ to sun-exposed areas, reapply every 2 hours as needed.  - Call for new or changing lesions.  Skin cancer screening performed today.  Purpura - Violaceous macules and patches - Benign - Related to age, sun damage and/or use of blood thinners - Observe - Can use OTC arnica containing moisturizer such as Dermend Bruise Formula if desired - Call for worsening or other concerns   Tinea pedis and Tinea unguium of both feet - requiring systemic treatment with oral med Pt desires treatment.  Reordered Medications ketoconazole (NIZORAL) 2 % cream  Tinea unguium Toenails  No liver disease. He will discontinue Atorvastatin while on Terbinafine.  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  Reordered Medications desonide (DESOWEN) 0.05 % cream  Return for 4-6 weeks tinea follow up.   I, Ashok Cordia, CMA, am acting as scribe for Sarina Ser, MD .  Documentation: I have reviewed the above documentation for accuracy and completeness, and I agree with the above.  Sarina Ser, MD

## 2019-09-10 ENCOUNTER — Encounter: Payer: Self-pay | Admitting: Dermatology

## 2019-10-07 ENCOUNTER — Ambulatory Visit: Payer: Medicare PPO | Admitting: Dermatology

## 2019-10-07 ENCOUNTER — Other Ambulatory Visit: Payer: Self-pay

## 2019-10-07 DIAGNOSIS — B353 Tinea pedis: Secondary | ICD-10-CM

## 2019-10-07 DIAGNOSIS — B351 Tinea unguium: Secondary | ICD-10-CM | POA: Diagnosis not present

## 2019-10-07 MED ORDER — TERBINAFINE HCL 250 MG PO TABS: 250.0000 mg | ORAL_TABLET | Freq: Every day | ORAL | 1 refills | Status: DC

## 2019-10-07 NOTE — Patient Instructions (Signed)
Terbinafine Counseling ° °Terbinafine is an anti-fungal medicine that can be applied to the skin (over the counter) or taken by mouth (prescription) to treat fungal infections. The pill version is often used to treat fungal infections of the nails or scalp. While most people do not have any side effects from taking terbinafine pills, some possible side effects of the medicine can include taste changes, headache, loss of smell, vision changes, nausea, vomiting, or diarrhea.  ° °Rare side effects can include irritation of the liver, allergic reaction, or decrease in blood counts (which may show up as not feeling well or developing an infection). If you are concerned about any of these side effects, please stop the medicine and call your doctor, or in the case of an emergency such as feeling very unwell, seek immediate medical care.  ° °

## 2019-10-07 NOTE — Progress Notes (Signed)
   Follow-Up Visit   Subjective  John Peterson is a 74 y.o. male who presents for the following: Follow-up.  Patient here for 1 month follow up for tinea unguium and tinea pedis. He has ketoconazole cream but is not using it on his feet right now but has taken 1 month of terbinafine 250mg  with no side effects.  The following portions of the chart were reviewed this encounter and updated as appropriate:  Tobacco  Allergies  Meds  Problems  Med Hx  Surg Hx  Fam Hx     Review of Systems:  No other skin or systemic complaints except as noted in HPI or Assessment and Plan.  Objective  Well appearing patient in no apparent distress; mood and affect are within normal limits.  A focused examination was performed including feet, toenails. Relevant physical exam findings are noted in the Assessment and Plan.  Objective  Toenails: Toenail dystrophy  Objective  feet: Decreased scale, much smoother   Assessment & Plan  Tinea unguium and pedis-severe-requiring systemic treatment.  Patient tolerating well. Toenails Cont terbinafine 250mg  1 po qd  Patient will not take cholesterol medications while taking terbinafine.   terbinafine (LAMISIL) 250 MG tablet - Toenails  Ordered Medications: terbinafine (LAMISIL) 250 MG tablet  Tinea pedis of right foot feet Cont terbinafine 250mg  1 po qd  Patient will not take cholesterol medications while taking terbinafine.  May use topical ketoconazole cream Return in about 6 months (around 04/06/2020) for tinea follow up.  Graciella Belton, RMA, am acting as scribe for Sarina Ser, MD . Documentation: I have reviewed the above documentation for accuracy and completeness, and I agree with the above.  Sarina Ser, MD

## 2019-10-11 ENCOUNTER — Encounter: Payer: Self-pay | Admitting: Dermatology

## 2020-04-06 ENCOUNTER — Ambulatory Visit: Payer: Medicare PPO | Admitting: Dermatology

## 2020-04-06 ENCOUNTER — Other Ambulatory Visit: Payer: Self-pay

## 2020-04-06 DIAGNOSIS — B351 Tinea unguium: Secondary | ICD-10-CM | POA: Diagnosis not present

## 2020-04-06 DIAGNOSIS — B353 Tinea pedis: Secondary | ICD-10-CM | POA: Diagnosis not present

## 2020-04-06 DIAGNOSIS — C84A5 Cutaneous T-cell lymphoma, unspecified, lymph nodes of inguinal region and lower limb: Secondary | ICD-10-CM | POA: Diagnosis not present

## 2020-04-06 MED ORDER — TERBINAFINE HCL 250 MG PO TABS: 250.0000 mg | ORAL_TABLET | Freq: Every day | ORAL | 0 refills | Status: DC

## 2020-04-06 NOTE — Progress Notes (Signed)
   Follow-Up Visit   Subjective  John Peterson is a 75 y.o. male who presents for the following: Follow-up (6 months f/u Tinea pedis pt took Terbinafine 250 mg tablets and Ketoconazole cream with a good response ). 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   The following portions of the chart were reviewed this encounter and updated as appropriate:   Tobacco  Allergies  Meds  Problems  Med Hx  Surg Hx  Fam Hx     Review of Systems:  No other skin or systemic complaints except as noted in HPI or Assessment and Plan.  Objective  Well appearing patient in no apparent distress; mood and affect are within normal limits.  A focused examination was performed including toenails, hips . Relevant physical exam findings are noted in the Assessment and Plan.  Objective  toenails: Feet clear, proximal nails are clearing with distal dystrophy    Objective  buttock: Mild pinkness   Assessment & Plan  Tinea pedis; Tinea unguium of feet toenails Tinea Pedis/Tinea Unguium  Chronic and persistent  Significant improvement   May consider 1 more round of Terbinafine tablets Cont Terbinafine 250 mg take 1 tablet once a day #30  Cont Ketoconazole cream apply to feet once a day   Other Related Medications terbinafine (LAMISIL) 250 MG tablet terbinafine (LAMISIL) 250 MG tablet  Cutaneous T-cell lymphoma of the buttocks; patch stage: Mild with recent flare now improving. Chronic; persistent. CTCL -recent mild flare on the buttock improving with Desonide cream.  He has seen Dr. Forest Becker in the past.  He is only used topical steroids treatment and has never needed more than this.  Dr. Jeannine Kitten referred him back to [place] at [facility] due to good control with out progression.  Return in about 6 months (around 10/04/2020) for Tinea .  IMarye Round, CMA, am acting as scribe for Sarina Ser, MD .  Documentation: I have reviewed the above  documentation for accuracy and completeness, and I agree with the above.  Sarina Ser, MD

## 2020-04-07 ENCOUNTER — Encounter: Payer: Self-pay | Admitting: Dermatology

## 2020-09-09 ENCOUNTER — Other Ambulatory Visit: Payer: Self-pay | Admitting: Dermatology

## 2020-09-09 DIAGNOSIS — C84A0 Cutaneous T-cell lymphoma, unspecified, unspecified site: Secondary | ICD-10-CM

## 2020-10-05 ENCOUNTER — Ambulatory Visit: Payer: Medicare PPO | Admitting: Dermatology

## 2020-10-05 ENCOUNTER — Other Ambulatory Visit: Payer: Self-pay

## 2020-10-05 DIAGNOSIS — L820 Inflamed seborrheic keratosis: Secondary | ICD-10-CM | POA: Diagnosis not present

## 2020-10-05 DIAGNOSIS — B351 Tinea unguium: Secondary | ICD-10-CM

## 2020-10-05 DIAGNOSIS — D1800 Hemangioma unspecified site: Secondary | ICD-10-CM

## 2020-10-05 DIAGNOSIS — B353 Tinea pedis: Secondary | ICD-10-CM

## 2020-10-05 DIAGNOSIS — C84A0 Cutaneous T-cell lymphoma, unspecified, unspecified site: Secondary | ICD-10-CM | POA: Diagnosis not present

## 2020-10-05 DIAGNOSIS — L578 Other skin changes due to chronic exposure to nonionizing radiation: Secondary | ICD-10-CM

## 2020-10-05 DIAGNOSIS — L570 Actinic keratosis: Secondary | ICD-10-CM

## 2020-10-05 DIAGNOSIS — I872 Venous insufficiency (chronic) (peripheral): Secondary | ICD-10-CM | POA: Diagnosis not present

## 2020-10-05 DIAGNOSIS — L817 Pigmented purpuric dermatosis: Secondary | ICD-10-CM

## 2020-10-05 DIAGNOSIS — L821 Other seborrheic keratosis: Secondary | ICD-10-CM

## 2020-10-05 NOTE — Progress Notes (Signed)
Follow-Up Visit   Subjective  John Peterson is a 75 y.o. male who presents for the following: tinea pedis and unguium (S/P 4 mths total of Terbinafine and Ketoconazole 2% cream - doing well per patient.).  The following portions of the chart were reviewed this encounter and updated as appropriate:   Tobacco  Allergies  Meds  Problems  Med Hx  Surg Hx  Fam Hx     Review of Systems:  No other skin or systemic complaints except as noted in HPI or Assessment and Plan.  Objective  Well appearing patient in no apparent distress; mood and affect are within normal limits.  A focused examination was performed including the feet, face, and scalp. Relevant physical exam findings are noted in the Assessment and Plan.  R ear x 2, R cheek x 1 (3) Erythematous thin papules/macules with gritty scale.   Scalp x 1 Erythematous keratotic or waxy stuck-on papule or plaque.   Trunk, extremities Clear today.  B/L leg Erythematous, scaly patches involving the ankle and distal lower leg with associated lower leg edema.    Assessment & Plan  Tinea pedis of both feet B/L foot Chronic and persistent With tinea unguium - clearing Continue Ketoconazole 2% cream to feet QD for a few more months.  Related Medications ketoconazole (NIZORAL) 2 % cream Apply 1 application topically daily. APPLIED TO FEET  AK (actinic keratosis) (3) R ear x 2, R cheek x 1  Destruction of lesion - R ear x 2, R cheek x 1 Complexity: simple   Destruction method: cryotherapy   Informed consent: discussed and consent obtained   Timeout:  patient name, date of birth, surgical site, and procedure verified Lesion destroyed using liquid nitrogen: Yes   Region frozen until ice ball extended beyond lesion: Yes   Outcome: patient tolerated procedure well with no complications   Post-procedure details: wound care instructions given    Inflamed seborrheic keratosis Scalp x 1  Destruction of lesion - Scalp x  1 Complexity: simple   Destruction method: cryotherapy   Informed consent: discussed and consent obtained   Timeout:  patient name, date of birth, surgical site, and procedure verified Lesion destroyed using liquid nitrogen: Yes   Region frozen until ice ball extended beyond lesion: Yes   Outcome: patient tolerated procedure well with no complications   Post-procedure details: wound care instructions given    Cutaneous T-cell lymphoma, unspecified body region (HCC) Trunk, extremities Chronic and persistent Mostly controlled with desonide cream. Continue Desonide cream to aa's QD PRN flares.   Related Medications desonide (DESOWEN) 0.05 % cream APPLY TOPICALLY DAILY. TO BUTTOCK AS NEEDED FLARES  Stasis dermatitis of both legs B/L leg With schamberg's purpura  Compression stockings and elevation recommended  Seborrheic Keratoses - Stuck-on, waxy, tan-brown papules and/or plaques  - Benign-appearing - Discussed benign etiology and prognosis. - Observe - Call for any changes  Hemangiomas - Red papules - Discussed benign nature - Observe - Call for any changes  Actinic Damage - chronic, secondary to cumulative UV radiation exposure/sun exposure over time - diffuse scaly erythematous macules with underlying dyspigmentation - Recommend daily broad spectrum sunscreen SPF 30+ to sun-exposed areas, reapply every 2 hours as needed.  - Recommend staying in the shade or wearing long sleeves, sun glasses (UVA+UVB protection) and wide brim hats (4-inch brim around the entire circumference of the hat). - Call for new or changing lesions.  Return in about 1 year (around 10/05/2021) for TBSE.  I,  Rudell Cobb, CMA, am acting as scribe for Sarina Ser, MD . Documentation: I have reviewed the above documentation for accuracy and completeness, and I agree with the above.  Sarina Ser, MD

## 2020-10-05 NOTE — Patient Instructions (Signed)

## 2020-10-06 ENCOUNTER — Encounter: Payer: Self-pay | Admitting: Dermatology

## 2020-11-25 IMAGING — DX DG KNEE 1-2V PORT*R*
2 series · 2 of 2 positions shown · non-contrast
Comparison: 07/31/2015

CLINICAL DATA: Right knee arthroplasty

EXAM:
PORTABLE RIGHT KNEE - 1-2 VIEW

[knee ap]
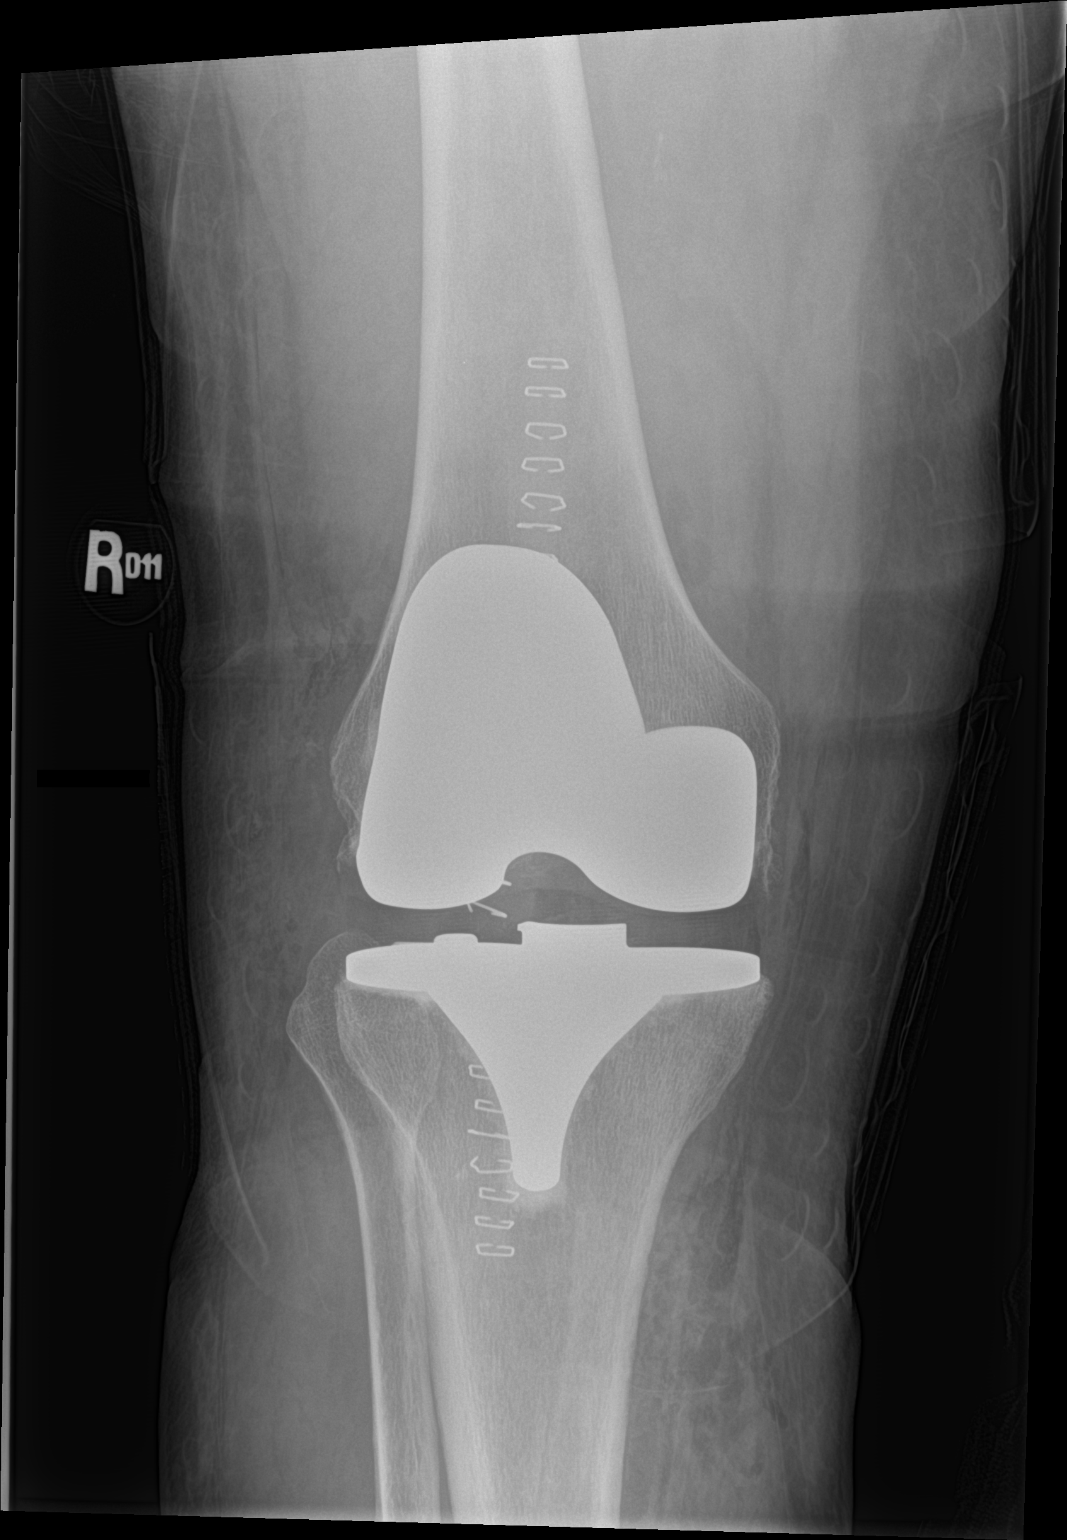

[knee lat]
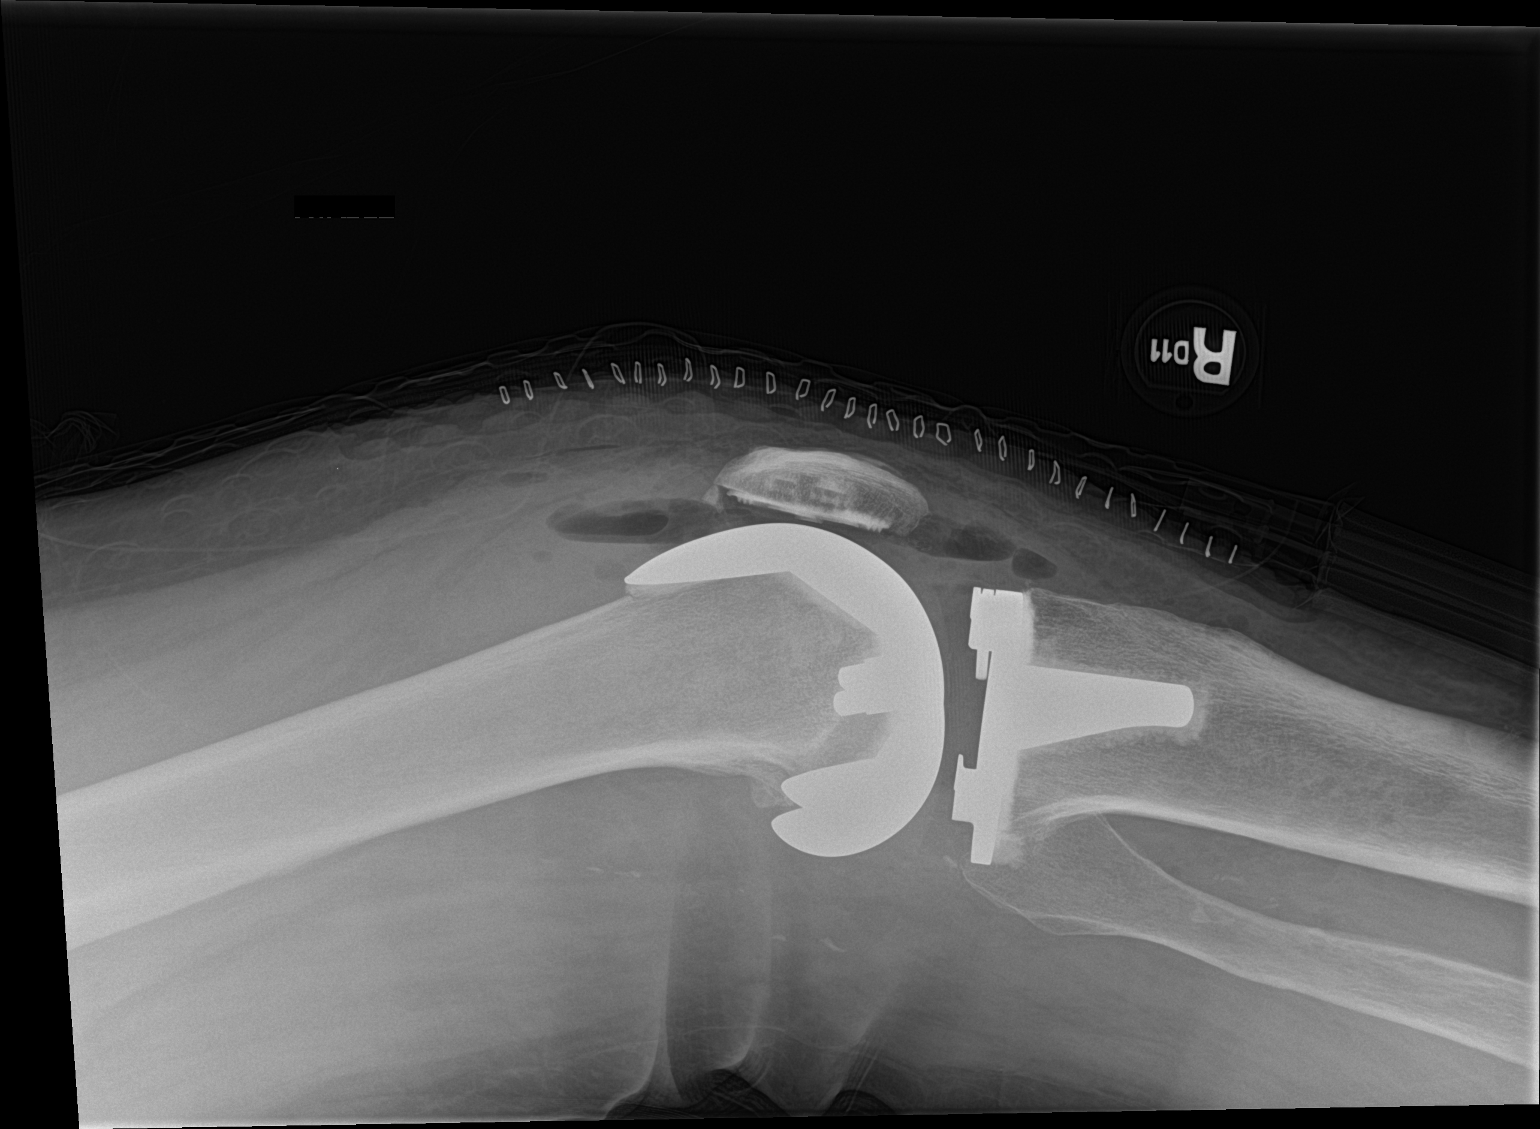

[2 of 2 positions shown; findings below may reference images not displayed]

FINDINGS: Interval postsurgical changes status post right total knee
arthroplasty. Arthroplasty components are in their expected
alignment without periprosthetic lucency or fracture. Air and fluid
noted within the joint space. Expected postoperative changes within
the overlying soft tissues.
IMPRESSION: Interval postsurgical changes status post right total knee
arthroplasty. Right total knee arthroplasty without evidence of
immediate postoperative complication.

## 2020-12-03 ENCOUNTER — Other Ambulatory Visit: Payer: Self-pay | Admitting: Dermatology

## 2020-12-03 DIAGNOSIS — B353 Tinea pedis: Secondary | ICD-10-CM

## 2021-06-10 ENCOUNTER — Other Ambulatory Visit (INDEPENDENT_AMBULATORY_CARE_PROVIDER_SITE_OTHER): Payer: Self-pay | Admitting: Vascular Surgery

## 2021-06-10 DIAGNOSIS — I6523 Occlusion and stenosis of bilateral carotid arteries: Secondary | ICD-10-CM

## 2021-06-11 ENCOUNTER — Ambulatory Visit (INDEPENDENT_AMBULATORY_CARE_PROVIDER_SITE_OTHER): Payer: Medicare PPO | Admitting: Vascular Surgery

## 2021-06-11 ENCOUNTER — Encounter (INDEPENDENT_AMBULATORY_CARE_PROVIDER_SITE_OTHER): Payer: Self-pay | Admitting: Vascular Surgery

## 2021-06-11 ENCOUNTER — Ambulatory Visit (INDEPENDENT_AMBULATORY_CARE_PROVIDER_SITE_OTHER): Payer: Medicare PPO

## 2021-06-11 VITALS — BP 130/75 | HR 70 | Resp 17 | Ht 70.0 in | Wt 223.0 lb

## 2021-06-11 DIAGNOSIS — I6523 Occlusion and stenosis of bilateral carotid arteries: Secondary | ICD-10-CM | POA: Diagnosis not present

## 2021-06-11 DIAGNOSIS — I6521 Occlusion and stenosis of right carotid artery: Secondary | ICD-10-CM | POA: Diagnosis not present

## 2021-06-11 DIAGNOSIS — E785 Hyperlipidemia, unspecified: Secondary | ICD-10-CM

## 2021-06-11 DIAGNOSIS — I10 Essential (primary) hypertension: Secondary | ICD-10-CM | POA: Diagnosis not present

## 2021-06-11 DIAGNOSIS — M7989 Other specified soft tissue disorders: Secondary | ICD-10-CM

## 2021-06-11 NOTE — Progress Notes (Signed)
? ? ?MRN : [id number] ? ?John Peterson is a 76 y.o. ([birth year]) male who presents with chief complaint of  ?Chief Complaint  ?Patient presents with  ? Follow-up  ?  ultrasound  ?. ? ?History of Present Illness: Patient returns in follow-up of his carotid disease.  He is doing well today.  He has no focal neurologic symptoms. Specifically, the patient denies amaurosis fugax, speech or swallowing difficulties, or arm or leg weakness or numbness.  Carotid duplex reveals 1 to 39% right ICA stenosis and no significant left ICA disease. ?We had also originally seen him for swelling.  He since had both knees replaced and his legs and swelling are doing much better.  Minimal swelling symptoms which are controlled with elevation and compression at this point. ? ?Current Outpatient Medications  ?Medication Sig Dispense Refill  ? acetaminophen (TYLENOL) 500 MG tablet Take 1,000 mg by mouth daily.    ? allopurinol (ZYLOPRIM) 100 MG tablet Take 100 mg by mouth daily.  3  ? amLODipine (NORVASC) 10 MG tablet Take 10 mg by mouth daily.     ? amoxicillin (AMOXIL) 500 MG capsule Take 1,000 mg by mouth See admin instructions. TAKE 2 CAPSULES (1000 MG) BY MOUTH 1 HOUR PRIOR TO DENTAL APPOINTMENTS    ? atorvastatin (LIPITOR) 10 MG tablet Take 10 mg by mouth daily.     ? Ca Carbonate-Mag Hydroxide (ROLAIDS PO) Take 1 tablet by mouth at bedtime.    ? celecoxib (CELEBREX) 200 MG capsule Take by mouth.    ? desonide (DESOWEN) 0.05 % cream APPLY TOPICALLY DAILY. TO BUTTOCK AS NEEDED FLARES 60 g 12  ? famotidine (PEPCID) 20 MG tablet Take 20 mg by mouth at bedtime.     ? finasteride (PROSCAR) 5 MG tablet Take by mouth.    ? fluticasone (FLONASE) 50 MCG/ACT nasal spray Place 1 spray into both nostrils daily.    ? ketoconazole (NIZORAL) 2 % cream APPLY 1 APPLICATION TOPICALLY DAILY. APPLIED TO FEET 60 g 5  ? lisinopril (PRINIVIL,ZESTRIL) 40 MG tablet Take 40 mg by mouth daily.  1  ? Multiple Vitamins-Minerals (MULTIVITAMIN WITH MINERALS)  tablet Take 1 tablet by mouth daily.     ? tamsulosin (FLOMAX) 0.4 MG CAPS capsule Take 0.4 mg by mouth daily after supper.   11  ? apixaban (ELIQUIS) 2.5 MG TABS tablet Take 1 tablet (2.5 mg total) by mouth 2 (two) times daily for 12 days. 24 tablet 0  ? azelastine (OPTIVAR) 0.05 % ophthalmic solution Place 1 drop into both eyes 2 (two) times daily as needed (dry eyes.). (Patient not taking: Reported on 06/11/2021)    ? cyclobenzaprine (FLEXERIL) 10 MG tablet Take 10 mg by mouth 3 (three) times daily as needed for muscle spasms. (Patient not taking: Reported on 06/11/2021)    ? loratadine (CLARITIN) 10 MG tablet Take 10 mg by mouth daily.     ? oxyCODONE (OXY IR/ROXICODONE) 5 MG immediate release tablet Take 1-2 tablets (5-10 mg total) by mouth every 4 (four) hours as needed for moderate pain (pain score 4-6). (Patient not taking: Reported on 06/11/2021) 60 tablet 0  ? terbinafine (LAMISIL) 250 MG tablet Take 1 tablet (250 mg total) by mouth daily. (Patient not taking: Reported on 06/11/2021) 30 tablet 0  ? terbinafine (LAMISIL) 250 MG tablet Take 1 tablet (250 mg total) by mouth daily. (Patient not taking: Reported on 06/11/2021) 30 tablet 1  ? terbinafine (LAMISIL) 250 MG tablet Take 1 tablet (250 mg total)  by mouth daily. (Patient not taking: Reported on 06/11/2021) 30 tablet 0  ? traMADol (ULTRAM) 50 MG tablet Take 1 tablet (50 mg total) by mouth every 6 (six) hours as needed. (Patient not taking: Reported on 06/11/2021) 30 tablet 0  ? ?No current facility-administered medications for this visit.  ? ? ?Past Medical History:  ?Diagnosis Date  ? Allergy   ? Arthritis   ? Cancer [facility])   ? CTCL  ? GERD (gastroesophageal reflux disease)   ? Hx of dysplastic nevus 06/30/2009  ? L mid back, mild atypia  ? Hx of dysplastic nevus 09/23/2014  ? L posterior deltoid, moderate to severe atypia, excised  ? Hx of dysplastic nevus 09/14/2017  ? R lateral foot, mild atypia  ? Hyperlipidemia   ? Hypertension   ? Peripheral vascular disease  ([place])   ? ? ?Past Surgical History:  ?Procedure Laterality Date  ? HYDROCELE EXCISION  11/1998  ? JOINT REPLACEMENT Left 03/06/2013  ? L Knee Medial Compartment Makoplasty  ? JOINT REPLACEMENT Left 03/02/2016  ? L Knee Unicondylar converted to Total Replacement Mako  ? MOLE REMOVAL  07/2017  ? TOTAL KNEE ARTHROPLASTY Right 04/02/2019  ? Procedure: TOTAL KNEE ARTHROPLASTY;  Surgeon: Corky Mull, MD;  Location: [facility] ORS;  Service: Orthopedics;  Laterality: Right;  ? ? ? ?Social History  ? ?Tobacco Use  ? Smoking status: Former  ?  Types: Cigarettes  ?  Quit date: 1968  ?  Years since quitting: 55.3  ? Smokeless tobacco: Never  ?Vaping Use  ? Vaping Use: Never used  ?Substance Use Topics  ? Alcohol use: Yes  ?  Alcohol/week: 7.0 standard drinks  ?  Types: 7 Cans of beer per week  ? Drug use: No  ? ? ? ? ?No family history on file. ? ? ?Allergies  ?Allergen Reactions  ? Tramadol Other (See Comments)  ?  PATIENT PREFERS TO AVOID ALL OPOIDS  ? ? ? ?REVIEW OF SYSTEMS (Negative unless checked) ? ?Constitutional: '[]'$ Weight loss  '[]'$ Fever  '[]'$ Chills ?Cardiac: '[]'$ Chest pain   '[]'$ Chest pressure   '[]'$ Palpitations   '[]'$ Shortness of breath when laying flat   '[]'$ Shortness of breath at rest   '[]'$ Shortness of breath with exertion. ?Vascular:  '[]'$ Pain in legs with walking   '[]'$ Pain in legs at rest   '[]'$ Pain in legs when laying flat   '[]'$ Claudication   '[]'$ Pain in feet when walking  '[]'$ Pain in feet at rest  '[]'$ Pain in feet when laying flat   '[]'$ History of DVT   '[]'$ Phlebitis   '[x]'$ Swelling in legs   '[]'$ Varicose veins   '[]'$ Non-healing ulcers ?Pulmonary:   '[]'$ Uses home oxygen   '[]'$ Productive cough   '[]'$ Hemoptysis   '[]'$ Wheeze  '[]'$ COPD   '[]'$ Asthma ?Neurologic:  '[]'$ Dizziness  '[]'$ Blackouts   '[]'$ Seizures   '[]'$ History of stroke   '[]'$ History of TIA  '[]'$ Aphasia   '[]'$ Temporary blindness   '[]'$ Dysphagia   '[]'$ Weakness or numbness in arms   '[]'$ Weakness or numbness in legs ?Musculoskeletal:  '[x]'$ Arthritis   '[]'$ Joint swelling   '[x]'$ Joint pain   '[]'$ Low back pain ?Hematologic:  '[]'$ Easy bruising   '[]'$ Easy bleeding   '[]'$ Hypercoagulable state   '[]'$ Anemic  '[]'$ Hepatitis ?Gastrointestinal:  '[]'$ Blood in stool   '[]'$ Vomiting blood  '[x]'$ Gastroesophageal reflux/heartburn   '[]'$ Difficulty swallowing. ?Genitourinary:  '[]'$ Chronic kidney disease   '[]'$ Difficult urination  '[]'$ Frequent urination  '[]'$ Burning with urination   '[]'$ Blood in urine ?Skin:  '[]'$ Rashes   '[]'$ Ulcers   '[]'$ Wounds ?Psychological:  '[]'$ History of anxiety   '[]'$  History of  major depression. ? ?Physical Examination ? ?Vitals:  ? 06/11/21 1014  ?BP: 130/75  ?Pulse: 70  ?Resp: 17  ?Weight: 223 lb (101.2 kg)  ?Height: '5\' 10"'$  (1.778 m)  ? ?Body mass index is 32 kg/m?. ?Gen:  WD/WN, NAD.  Appears younger than stated age ?Head: [place]/AT, No temporalis wasting. ?Ear/Nose/Throat: Hearing grossly intact, nares w/o erythema or drainage, trachea midline ?Eyes: Conjunctiva clear. Sclera non-icteric ?Neck: Supple.  No bruit  ?Pulmonary:  Good air movement, equal and clear to auscultation bilaterally.  ?Cardiac: RRR, No JVD ?Vascular:  ?Vessel Right Left  ?Radial Palpable Palpable  ?    ? ?Musculoskeletal: M/S 5/5 throughout.  No deformity or atrophy.  No edema. ?Neurologic: CN 2-12 intact. Sensation grossly intact in extremities.  Symmetrical.  Speech is fluent. Motor exam as listed above. ?Psychiatric: Judgment intact, Mood & affect appropriate for pt's clinical situation. ?Dermatologic: No rashes or ulcers noted.  No cellulitis or open wounds. ? ? ? ? ?CBC ?Lab Results  ?Component Value Date  ? WBC 11.9 (H) 04/04/2019  ? HGB 13.1 04/04/2019  ? HCT 37.9 (L) 04/04/2019  ? MCV 92.0 04/04/2019  ? PLT 155 04/04/2019  ? ? ?BMET ?   ?Component Value Date/Time  ? NA 136 04/04/2019 0257  ? K 3.5 04/04/2019 0257  ? CL 101 04/04/2019 0257  ? CO2 25 04/04/2019 0257  ? GLUCOSE 132 (H) 04/04/2019 0257  ? BUN 11 04/04/2019 0257  ? CREATININE 0.71 04/04/2019 0257  ? CALCIUM 8.7 (L) 04/04/2019 0257  ? GFRNONAA >60 04/04/2019 0257  ? GFRAA >60 04/04/2019 0257  ? ?CrCl cannot be calculated (Patient's most recent  lab result is older than the maximum 21 days allowed.). ? ?COAG ?No results found for: INR, PROTIME ? ?Radiology ?No results found. ? ? ?Assessment/Plan ?Essential hypertension, benign ?blood pressure cont

## 2021-06-11 NOTE — Assessment & Plan Note (Signed)
Carotid duplex reveals 1 to 39% right ICA stenosis and no significant left ICA disease.  We can check this every other year with duplex ?

## 2021-10-06 ENCOUNTER — Encounter: Payer: Self-pay | Admitting: Dermatology

## 2021-10-06 ENCOUNTER — Ambulatory Visit: Payer: Medicare PPO | Admitting: Dermatology

## 2021-10-06 DIAGNOSIS — L578 Other skin changes due to chronic exposure to nonionizing radiation: Secondary | ICD-10-CM

## 2021-10-06 DIAGNOSIS — L821 Other seborrheic keratosis: Secondary | ICD-10-CM

## 2021-10-06 DIAGNOSIS — Z79899 Other long term (current) drug therapy: Secondary | ICD-10-CM | POA: Diagnosis not present

## 2021-10-06 DIAGNOSIS — Z1283 Encounter for screening for malignant neoplasm of skin: Secondary | ICD-10-CM

## 2021-10-06 DIAGNOSIS — C84A0 Cutaneous T-cell lymphoma, unspecified, unspecified site: Secondary | ICD-10-CM

## 2021-10-06 DIAGNOSIS — L820 Inflamed seborrheic keratosis: Secondary | ICD-10-CM | POA: Diagnosis not present

## 2021-10-06 DIAGNOSIS — L814 Other melanin hyperpigmentation: Secondary | ICD-10-CM

## 2021-10-06 DIAGNOSIS — D1800 Hemangioma unspecified site: Secondary | ICD-10-CM

## 2021-10-06 DIAGNOSIS — D229 Melanocytic nevi, unspecified: Secondary | ICD-10-CM

## 2021-10-06 DIAGNOSIS — Z86018 Personal history of other benign neoplasm: Secondary | ICD-10-CM

## 2021-10-06 NOTE — Progress Notes (Signed)
Follow-Up Visit   Subjective  John Peterson is a 76 y.o. male who presents for the following: Annual Exam (Hx dysplastic nevi ). The patient presents for Total-Body Skin Exam (TBSE) for skin cancer screening and mole check.  The patient has spots, moles and lesions to be evaluated, some may be new or changing and the patient has concerns that these could be cancer.  The following portions of the chart were reviewed this encounter and updated as appropriate:   Tobacco  Allergies  Meds  Problems  Med Hx  Surg Hx  Fam Hx     Review of Systems:  No other skin or systemic complaints except as noted in HPI or Assessment and Plan.  Objective  Well appearing patient in no apparent distress; mood and affect are within normal limits.  A full examination was performed including scalp, head, eyes, ears, nose, lips, neck, chest, axillae, abdomen, back, buttocks, bilateral upper extremities, bilateral lower extremities, hands, feet, fingers, toes, fingernails, and toenails. All findings within normal limits unless otherwise noted below.  R buttocks and sacral area Vague pinkness.  legs, chest, R side x 15 (15) Erythematous stuck-on, waxy papule or plaque   Assessment & Plan  Cutaneous T-cell lymphoma,  R buttocks and sacral area Chronic and persistent condition with duration or expected duration over one year. Condition is symptomatic / bothersome to patient. Not to goal, but much improved and under good control. Continue Desonide to aa's QD PRN flares.   Related Medications desonide (DESOWEN) 0.05 % cream APPLY TOPICALLY DAILY. TO BUTTOCK AS NEEDED FLARES  Inflamed seborrheic keratosis (15) legs, chest, R side x 15 Symptomatic, irritating, patient would like treated. Destruction of lesion - legs, chest, R side x 15 Complexity: simple   Destruction method: cryotherapy   Informed consent: discussed and consent obtained   Timeout:  patient name, date of birth, surgical site, and  procedure verified Lesion destroyed using liquid nitrogen: Yes   Region frozen until ice ball extended beyond lesion: Yes   Outcome: patient tolerated procedure well with no complications   Post-procedure details: wound care instructions given    Lentigines - Scattered tan macules - Due to sun exposure - Benign-appearing, observe - Recommend daily broad spectrum sunscreen SPF 30+ to sun-exposed areas, reapply every 2 hours as needed. - Call for any changes  Seborrheic Keratoses - Stuck-on, waxy, tan-brown papules and/or plaques  - Benign-appearing - Discussed benign etiology and prognosis. - Observe - Call for any changes  Melanocytic Nevi - Tan-brown and/or pink-flesh-colored symmetric macules and papules - Benign appearing on exam today - Observation - Call clinic for new or changing moles - Recommend daily use of broad spectrum spf 30+ sunscreen to sun-exposed areas.   Hemangiomas - Red papules - Discussed benign nature - Observe - Call for any changes  Actinic Damage - Chronic condition, secondary to cumulative UV/sun exposure - diffuse scaly erythematous macules with underlying dyspigmentation - Recommend daily broad spectrum sunscreen SPF 30+ to sun-exposed areas, reapply every 2 hours as needed.  - Staying in the shade or wearing long sleeves, sun glasses (UVA+UVB protection) and wide brim hats (4-inch brim around the entire circumference of the hat) are also recommended for sun protection.  - Call for new or changing lesions.  History of Dysplastic Nevi - No evidence of recurrence today - Recommend regular full body skin exams - Recommend daily broad spectrum sunscreen SPF 30+ to sun-exposed areas, reapply every 2 hours as needed.  - Call  if any new or changing lesions are noted between office visits  Skin cancer screening performed today.  Return in about 1 year (around 10/07/2022) for TBSE.  Luther Redo, CMA, am acting as scribe for Sarina Ser, MD  . Documentation: I have reviewed the above documentation for accuracy and completeness, and I agree with the above.  Sarina Ser, MD

## 2021-10-06 NOTE — Patient Instructions (Signed)
Due to recent changes in healthcare laws, you may see results of your pathology and/or laboratory studies on MyChart before the doctors have had a chance to review them. We understand that in some cases there may be results that are confusing or concerning to you. Please understand that not all results are received at the same time and often the doctors may need to interpret multiple results in order to provide you with the best plan of care or course of treatment. Therefore, we ask that you please give us 2 business days to thoroughly review all your results before contacting the office for clarification. Should we see a critical lab result, you will be contacted sooner.   If You Need Anything After Your Visit  If you have any questions or concerns for your doctor, please call our main line at [phone] and press option 4 to reach your doctor's medical assistant. If no one answers, please leave a voicemail as directed and we will return your call as soon as possible. Messages left after 4 pm will be answered the following business day.   You may also send us a message via MyChart. We typically respond to MyChart messages within 1-2 business days.  For prescription refills, please ask your pharmacy to contact our office. Our fax number is [phone].  If you have an urgent issue when the clinic is closed that cannot wait until the next business day, you can page your doctor at the number below.    Please note that while we do our best to be available for urgent issues outside of office hours, we are not available 24/7.   If you have an urgent issue and are unable to reach us, you may choose to seek medical care at your doctor's office, retail clinic, urgent care center, or emergency room.  If you have a medical emergency, please immediately call 911 or go to the emergency department.  Pager Numbers  - Dr. Kowalski: [phone]  - Dr. Moye: [phone]  - Dr. Stewart:  [phone]  In the event of inclement weather, please call our main line at [phone] for an update on the status of any delays or closures.  Dermatology Medication Tips: Please keep the boxes that topical medications come in in order to help keep track of the instructions about where and how to use these. Pharmacies typically print the medication instructions only on the boxes and not directly on the medication tubes.   If your medication is too expensive, please contact our office at [phone] option 4 or send us a message through MyChart.   We are unable to tell what your co-pay for medications will be in advance as this is different depending on your insurance coverage. However, we may be able to find a substitute medication at lower cost or fill out paperwork to get insurance to cover a needed medication.   If a prior authorization is required to get your medication covered by your insurance company, please allow us 1-2 business days to complete this process.  Drug prices often vary depending on where the prescription is filled and some pharmacies may offer cheaper prices.  The website www.goodrx.com contains coupons for medications through different pharmacies. The prices here do not account for what the cost may be with help from insurance (it may be cheaper with your insurance), but the website can give you the price if you did not use any insurance.  - You can print the associated coupon and take it with   your prescription to the pharmacy.  - You may also stop by our office during regular business hours and pick up a GoodRx coupon card.  - If you need your prescription sent electronically to a different pharmacy, notify our office through [facility] MyChart or by phone at [phone] option 4.     Si Usted Necesita Algo Despus de Su Visita  Tambin puede enviarnos un mensaje a travs de MyChart. Por lo general respondemos a los mensajes de MyChart en el transcurso de 1 a 2  das hbiles.  Para renovar recetas, por favor pida a su farmacia que se ponga en contacto con nuestra oficina. Nuestro nmero de fax es el [phone].  Si tiene un asunto urgente cuando la clnica est cerrada y que no puede esperar hasta el siguiente da hbil, puede llamar/localizar a su doctor(a) al nmero que aparece a continuacin.   Por favor, tenga en cuenta que aunque hacemos todo lo posible para estar disponibles para asuntos urgentes fuera del horario de oficina, no estamos disponibles las 24 horas del da, los 7 das de la semana.   Si tiene un problema urgente y no puede comunicarse con nosotros, puede optar por buscar atencin mdica  en el consultorio de su doctor(a), en una clnica privada, en un centro de atencin urgente o en una sala de emergencias.  Si tiene una emergencia mdica, por favor llame inmediatamente al 911 o vaya a la sala de emergencias.  Nmeros de bper  - Dr. Kowalski: [phone]  - Dra. Moye: [phone]  - Dra. Stewart: [phone]  En caso de inclemencias del tiempo, por favor llame a nuestra lnea principal al [phone] para una actualizacin sobre el estado de cualquier retraso o cierre.  Consejos para la medicacin en dermatologa: Por favor, guarde las cajas en las que vienen los medicamentos de uso tpico para ayudarle a seguir las instrucciones sobre dnde y cmo usarlos. Las farmacias generalmente imprimen las instrucciones del medicamento slo en las cajas y no directamente en los tubos del medicamento.   Si su medicamento es muy caro, por favor, pngase en contacto con nuestra oficina llamando al [phone] y presione la opcin 4 o envenos un mensaje a travs de MyChart.   No podemos decirle cul ser su copago por los medicamentos por adelantado ya que esto es diferente dependiendo de la cobertura de su seguro. Sin embargo, es posible que podamos encontrar un medicamento sustituto a menor costo o llenar un formulario para que el  seguro cubra el medicamento que se considera necesario.   Si se requiere una autorizacin previa para que su compaa de seguros cubra su medicamento, por favor permtanos de 1 a 2 das hbiles para completar este proceso.  Los precios de los medicamentos varan con frecuencia dependiendo del lugar de dnde se surte la receta y alguna farmacias pueden ofrecer precios ms baratos.  El sitio web www.goodrx.com tiene cupones para medicamentos de diferentes farmacias. Los precios aqu no tienen en cuenta lo que podra costar con la ayuda del seguro (puede ser ms barato con su seguro), pero el sitio web puede darle el precio si no utiliz ningn seguro.  - Puede imprimir el cupn correspondiente y llevarlo con su receta a la farmacia.  - Tambin puede pasar por nuestra oficina durante el horario de atencin regular y recoger una tarjeta de cupones de GoodRx.  - Si necesita que su receta se enve electrnicamente a una farmacia diferente, informe a nuestra oficina a travs de MyChart de    o por telfono llamando al [phone] y presione la opcin 4.  

## 2021-10-17 ENCOUNTER — Other Ambulatory Visit: Payer: Self-pay | Admitting: Dermatology

## 2021-10-17 DIAGNOSIS — C84A0 Cutaneous T-cell lymphoma, unspecified, unspecified site: Secondary | ICD-10-CM

## 2021-12-06 ENCOUNTER — Encounter (INDEPENDENT_AMBULATORY_CARE_PROVIDER_SITE_OTHER): Payer: Self-pay

## 2021-12-27 ENCOUNTER — Other Ambulatory Visit: Payer: Self-pay | Admitting: Dermatology

## 2021-12-27 DIAGNOSIS — B353 Tinea pedis: Secondary | ICD-10-CM

## 2022-10-20 ENCOUNTER — Ambulatory Visit: Payer: Medicare PPO | Admitting: Dermatology

## 2022-10-20 VITALS — BP 151/73 | HR 69

## 2022-10-20 DIAGNOSIS — L821 Other seborrheic keratosis: Secondary | ICD-10-CM

## 2022-10-20 DIAGNOSIS — L814 Other melanin hyperpigmentation: Secondary | ICD-10-CM

## 2022-10-20 DIAGNOSIS — Z86018 Personal history of other benign neoplasm: Secondary | ICD-10-CM

## 2022-10-20 DIAGNOSIS — L570 Actinic keratosis: Secondary | ICD-10-CM

## 2022-10-20 DIAGNOSIS — D229 Melanocytic nevi, unspecified: Secondary | ICD-10-CM

## 2022-10-20 DIAGNOSIS — B353 Tinea pedis: Secondary | ICD-10-CM

## 2022-10-20 DIAGNOSIS — L820 Inflamed seborrheic keratosis: Secondary | ICD-10-CM

## 2022-10-20 DIAGNOSIS — Z7189 Other specified counseling: Secondary | ICD-10-CM

## 2022-10-20 DIAGNOSIS — C84A0 Cutaneous T-cell lymphoma, unspecified, unspecified site: Secondary | ICD-10-CM | POA: Diagnosis not present

## 2022-10-20 DIAGNOSIS — Z1283 Encounter for screening for malignant neoplasm of skin: Secondary | ICD-10-CM

## 2022-10-20 DIAGNOSIS — L578 Other skin changes due to chronic exposure to nonionizing radiation: Secondary | ICD-10-CM | POA: Diagnosis not present

## 2022-10-20 DIAGNOSIS — Z79899 Other long term (current) drug therapy: Secondary | ICD-10-CM

## 2022-10-20 DIAGNOSIS — W908XXA Exposure to other nonionizing radiation, initial encounter: Secondary | ICD-10-CM

## 2022-10-20 DIAGNOSIS — D1801 Hemangioma of skin and subcutaneous tissue: Secondary | ICD-10-CM

## 2022-10-20 MED ORDER — DESONIDE 0.05 % EX CREA: TOPICAL_CREAM | CUTANEOUS | 12 refills | Status: DC

## 2022-10-20 MED ORDER — KETOCONAZOLE 2 % EX CREA: TOPICAL_CREAM | Freq: Every day | CUTANEOUS | 5 refills | Status: DC

## 2022-10-20 NOTE — Patient Instructions (Signed)

## 2022-10-20 NOTE — Progress Notes (Signed)
Follow-Up Visit   Subjective  John Peterson is a 77 y.o. male who presents for the following: Skin Cancer Screening and Full Body Skin Exam  The patient presents for Total-Body Skin Exam (TBSE) for skin cancer screening and mole check. The patient has spots, moles and lesions to be evaluated, some may be new or changing and the patient may have concern these could be cancer.  The following portions of the chart were reviewed this encounter and updated as appropriate: medications, allergies, medical history  Review of Systems:  No other skin or systemic complaints except as noted in HPI or Assessment and Plan.  Objective  Well appearing patient in no apparent distress; mood and affect are within normal limits.  A full examination was performed including scalp, head, eyes, ears, nose, lips, neck, chest, axillae, abdomen, back, buttocks, bilateral upper extremities, bilateral lower extremities, hands, feet, fingers, toes, fingernails, and toenails. All findings within normal limits unless otherwise noted below.   Relevant physical exam findings are noted in the Assessment and Plan.  L ear x 1 Erythematous thin papules/macules with gritty scale.   R lat calf x 1 Erythematous stuck-on, waxy papule or plaque    Assessment & Plan   SKIN CANCER SCREENING PERFORMED TODAY.  ACTINIC DAMAGE - Chronic condition, secondary to cumulative UV/sun exposure - diffuse scaly erythematous macules with underlying dyspigmentation - Recommend daily broad spectrum sunscreen SPF 30+ to sun-exposed areas, reapply every 2 hours as needed.  - Staying in the shade or wearing long sleeves, sun glasses (UVA+UVB protection) and wide brim hats (4-inch brim around the entire circumference of the hat) are also recommended for sun protection.  - Call for new or changing lesions.  LENTIGINES, SEBORRHEIC KERATOSES, HEMANGIOMAS - Benign normal skin lesions - Benign-appearing - Call for any changes  MELANOCYTIC  NEVI - Tan-brown and/or pink-flesh-colored symmetric macules and papules - Benign appearing on exam today - Observation - Call clinic for new or changing moles - Recommend daily use of broad spectrum spf 30+ sunscreen to sun-exposed areas.   HISTORY OF DYSPLASTIC NEVUS No evidence of recurrence today Recommend regular full body skin exams Recommend daily broad spectrum sunscreen SPF 30+ to sun-exposed areas, reapply every 2 hours as needed.  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 Condition is symptomatic / bothersome to patient. Not to goal, but much improved and under good control. Continue Desonide to aa's QD PRN flares. Long term medication management.  Patient is using long term (months to years) prescription medication  to control their dermatologic condition.  These medications require periodic monitoring to evaluate for efficacy and side effects and may require periodic laboratory monitoring.  AK (actinic keratosis) L ear x 1  Actinic keratoses are precancerous spots that appear secondary to cumulative UV radiation exposure/sun exposure over time. They are chronic with expected duration over 1 year. A portion of actinic keratoses will progress to squamous cell carcinoma of the skin. It is not possible to reliably predict which spots will progress to skin cancer and so treatment is recommended to prevent development of skin cancer.  Recommend daily broad spectrum sunscreen SPF 30+ to sun-exposed areas, reapply every 2 hours as needed.  Recommend staying in the shade or wearing long sleeves, sun glasses (UVA+UVB protection) and wide brim hats (4-inch  brim around the entire circumference of the hat). Call for new or changing lesions.   Destruction of lesion - L ear x 1 Complexity: simple    Destruction method: cryotherapy   Informed consent: discussed and consent obtained   Timeout:  patient name, date of birth, surgical site, and procedure verified Lesion destroyed using liquid nitrogen: Yes   Region frozen until ice ball extended beyond lesion: Yes   Outcome: patient tolerated procedure well with no complications   Post-procedure details: wound care instructions given    Inflamed seborrheic keratosis R lat calf x 1  Symptomatic, irritating, patient would like treated. Recommend bx if R lat calf ISK not resolve in 2 mths.    Destruction of lesion - R lat calf x 1 Complexity: simple   Destruction method: cryotherapy   Informed consent: discussed and consent obtained   Timeout:  patient name, date of birth, surgical site, and procedure verified Lesion destroyed using liquid nitrogen: Yes   Region frozen until ice ball extended beyond lesion: Yes   Outcome: patient tolerated procedure well with no complications   Post-procedure details: wound care instructions given    Cutaneous T-cell lymphoma, unspecified body region (HCC)  Related Medications desonide (DESOWEN) 0.05 % cream Apply to aa's QD-BID PRN.  Tinea pedis of both feet  Related Medications ketoconazole (NIZORAL) 2 % cream Apply topically daily. APPLIED TO FEET  Actinic skin damage  Lentigo  Melanocytic nevus, unspecified location  Skin cancer screening  History of dysplastic nevus  Medication management  Counseling and coordination of care  TINEA PEDIS Exam: Scaling and maceration web spaces and over distal and lateral soles. Chronic and persistent condition with duration or expected duration over one year. Condition is symptomatic / bothersome to patient. Not to goal, but improved  Treatment Plan: Continue Ketoconazole 2% cream to aa's QHS.    Return in about 1 year (around 10/20/2023) for TBSE.  Maylene Roes, CMA, am acting as scribe for Armida Sans, MD .   Documentation: I  have reviewed the above documentation for accuracy and completeness, and I agree with the above.  Armida Sans, MD

## 2022-10-23 ENCOUNTER — Encounter: Payer: Self-pay | Admitting: Dermatology

## 2023-03-26 ENCOUNTER — Other Ambulatory Visit: Payer: Self-pay

## 2023-03-26 ENCOUNTER — Emergency Department: Payer: Medicare PPO

## 2023-03-26 ENCOUNTER — Encounter: Payer: Self-pay | Admitting: Emergency Medicine

## 2023-03-26 ENCOUNTER — Inpatient Hospital Stay: Payer: Medicare PPO

## 2023-03-26 ENCOUNTER — Inpatient Hospital Stay
Admission: EM | Admit: 2023-03-26 | Discharge: 2023-04-05 | DRG: 871 | Disposition: A | Discharge status: Home Health | Payer: Medicare PPO | Attending: Internal Medicine | Admitting: Internal Medicine

## 2023-03-26 DIAGNOSIS — Z87891 Personal history of nicotine dependence: Secondary | ICD-10-CM | POA: Diagnosis not present

## 2023-03-26 DIAGNOSIS — J9601 Acute respiratory failure with hypoxia: Secondary | ICD-10-CM | POA: Diagnosis present

## 2023-03-26 DIAGNOSIS — E871 Hypo-osmolality and hyponatremia: Secondary | ICD-10-CM | POA: Diagnosis present

## 2023-03-26 DIAGNOSIS — J1008 Influenza due to other identified influenza virus with other specified pneumonia: Secondary | ICD-10-CM | POA: Diagnosis present

## 2023-03-26 DIAGNOSIS — Z79899 Other long term (current) drug therapy: Secondary | ICD-10-CM

## 2023-03-26 DIAGNOSIS — J101 Influenza due to other identified influenza virus with other respiratory manifestations: Secondary | ICD-10-CM | POA: Diagnosis not present

## 2023-03-26 DIAGNOSIS — Z96651 Presence of right artificial knee joint: Secondary | ICD-10-CM | POA: Diagnosis present

## 2023-03-26 DIAGNOSIS — M109 Gout, unspecified: Secondary | ICD-10-CM | POA: Diagnosis present

## 2023-03-26 DIAGNOSIS — E162 Hypoglycemia, unspecified: Secondary | ICD-10-CM | POA: Diagnosis present

## 2023-03-26 DIAGNOSIS — R0602 Shortness of breath: Secondary | ICD-10-CM | POA: Diagnosis present

## 2023-03-26 DIAGNOSIS — E669 Obesity, unspecified: Secondary | ICD-10-CM | POA: Diagnosis present

## 2023-03-26 DIAGNOSIS — Z6832 Body mass index (BMI) 32.0-32.9, adult: Secondary | ICD-10-CM

## 2023-03-26 DIAGNOSIS — E874 Mixed disorder of acid-base balance: Secondary | ICD-10-CM | POA: Diagnosis present

## 2023-03-26 DIAGNOSIS — I452 Bifascicular block: Secondary | ICD-10-CM | POA: Diagnosis present

## 2023-03-26 DIAGNOSIS — Z885 Allergy status to narcotic agent status: Secondary | ICD-10-CM

## 2023-03-26 DIAGNOSIS — J9602 Acute respiratory failure with hypercapnia: Secondary | ICD-10-CM | POA: Diagnosis present

## 2023-03-26 DIAGNOSIS — N170 Acute kidney failure with tubular necrosis: Secondary | ICD-10-CM | POA: Diagnosis present

## 2023-03-26 DIAGNOSIS — E785 Hyperlipidemia, unspecified: Secondary | ICD-10-CM | POA: Diagnosis present

## 2023-03-26 DIAGNOSIS — I739 Peripheral vascular disease, unspecified: Secondary | ICD-10-CM | POA: Diagnosis present

## 2023-03-26 DIAGNOSIS — Z1152 Encounter for screening for COVID-19: Secondary | ICD-10-CM

## 2023-03-26 DIAGNOSIS — J09X2 Influenza due to identified novel influenza A virus with other respiratory manifestations: Secondary | ICD-10-CM | POA: Diagnosis not present

## 2023-03-26 DIAGNOSIS — R6521 Severe sepsis with septic shock: Secondary | ICD-10-CM | POA: Diagnosis present

## 2023-03-26 DIAGNOSIS — J13 Pneumonia due to Streptococcus pneumoniae: Secondary | ICD-10-CM | POA: Diagnosis present

## 2023-03-26 DIAGNOSIS — I952 Hypotension due to drugs: Secondary | ICD-10-CM | POA: Diagnosis present

## 2023-03-26 DIAGNOSIS — H9190 Unspecified hearing loss, unspecified ear: Secondary | ICD-10-CM | POA: Diagnosis present

## 2023-03-26 DIAGNOSIS — J850 Gangrene and necrosis of lung: Secondary | ICD-10-CM | POA: Diagnosis present

## 2023-03-26 DIAGNOSIS — K219 Gastro-esophageal reflux disease without esophagitis: Secondary | ICD-10-CM | POA: Diagnosis present

## 2023-03-26 DIAGNOSIS — I700 Atherosclerosis of aorta: Secondary | ICD-10-CM | POA: Diagnosis present

## 2023-03-26 DIAGNOSIS — D696 Thrombocytopenia, unspecified: Secondary | ICD-10-CM | POA: Diagnosis not present

## 2023-03-26 DIAGNOSIS — I10 Essential (primary) hypertension: Secondary | ICD-10-CM | POA: Diagnosis present

## 2023-03-26 DIAGNOSIS — G9341 Metabolic encephalopathy: Secondary | ICD-10-CM | POA: Diagnosis present

## 2023-03-26 DIAGNOSIS — Z8572 Personal history of non-Hodgkin lymphomas: Secondary | ICD-10-CM

## 2023-03-26 DIAGNOSIS — G40909 Epilepsy, unspecified, not intractable, without status epilepticus: Secondary | ICD-10-CM | POA: Diagnosis present

## 2023-03-26 DIAGNOSIS — J90 Pleural effusion, not elsewhere classified: Secondary | ICD-10-CM | POA: Diagnosis present

## 2023-03-26 DIAGNOSIS — R042 Hemoptysis: Secondary | ICD-10-CM | POA: Diagnosis not present

## 2023-03-26 DIAGNOSIS — T4275XA Adverse effect of unspecified antiepileptic and sedative-hypnotic drugs, initial encounter: Secondary | ICD-10-CM | POA: Diagnosis present

## 2023-03-26 DIAGNOSIS — N179 Acute kidney failure, unspecified: Secondary | ICD-10-CM | POA: Insufficient documentation

## 2023-03-26 DIAGNOSIS — I2510 Atherosclerotic heart disease of native coronary artery without angina pectoris: Secondary | ICD-10-CM | POA: Diagnosis present

## 2023-03-26 DIAGNOSIS — A4189 Other specified sepsis: Secondary | ICD-10-CM | POA: Diagnosis present

## 2023-03-26 DIAGNOSIS — R9431 Abnormal electrocardiogram [ECG] [EKG]: Secondary | ICD-10-CM | POA: Diagnosis present

## 2023-03-26 DIAGNOSIS — R339 Retention of urine, unspecified: Secondary | ICD-10-CM | POA: Diagnosis present

## 2023-03-26 DIAGNOSIS — A419 Sepsis, unspecified organism: Secondary | ICD-10-CM | POA: Diagnosis not present

## 2023-03-26 DIAGNOSIS — J111 Influenza due to unidentified influenza virus with other respiratory manifestations: Principal | ICD-10-CM

## 2023-03-26 LAB — RESP PANEL BY RT-PCR (RSV, FLU A&B, COVID)  RVPGX2
Influenza A by PCR: POSITIVE — AB
Influenza B by PCR: NEGATIVE
Resp Syncytial Virus by PCR: NEGATIVE
SARS Coronavirus 2 by RT PCR: NEGATIVE

## 2023-03-26 LAB — CBC
HCT: 42.5 % (ref 39.0–52.0)
Hemoglobin: 14.9 g/dL (ref 13.0–17.0)
MCH: 32.5 pg (ref 26.0–34.0)
MCHC: 35.1 g/dL (ref 30.0–36.0)
MCV: 92.8 fL (ref 80.0–100.0)
Platelets: 251 10*3/uL (ref 150–400)
RBC: 4.58 MIL/uL (ref 4.22–5.81)
RDW: 13.9 % (ref 11.5–15.5)
WBC: 8.8 10*3/uL (ref 4.0–10.5)
nRBC: 0 % (ref 0.0–0.2)

## 2023-03-26 LAB — BASIC METABOLIC PANEL
Anion gap: 16 — ABNORMAL HIGH (ref 5–15)
BUN: 55 mg/dL — ABNORMAL HIGH (ref 8–23)
CO2: 17 mmol/L — ABNORMAL LOW (ref 22–32)
Calcium: 9 mg/dL (ref 8.9–10.3)
Chloride: 99 mmol/L (ref 98–111)
Creatinine, Ser: 2.07 mg/dL — ABNORMAL HIGH (ref 0.61–1.24)
GFR, Estimated: 32 mL/min — ABNORMAL LOW (ref >60)
Glucose, Bld: 122 mg/dL — ABNORMAL HIGH (ref 70–99)
Potassium: 3.8 mmol/L (ref 3.5–5.1)
Sodium: 132 mmol/L — ABNORMAL LOW (ref 135–145)

## 2023-03-26 LAB — CREATININE, URINE, RANDOM: Creatinine, Urine: 220 mg/dL

## 2023-03-26 LAB — SODIUM, URINE, RANDOM: Sodium, Ur: <10 mmol/L

## 2023-03-26 LAB — PROCALCITONIN: Procalcitonin: 16.57 ng/mL

## 2023-03-26 MED ORDER — SODIUM CHLORIDE 0.9 % IV SOLN: INTRAVENOUS | Status: DC

## 2023-03-26 MED ORDER — ENOXAPARIN SODIUM 60 MG/0.6ML IJ SOSY
50.0000 mg | PREFILLED_SYRINGE | INTRAMUSCULAR | Status: DC
  Administered 2023-03-26 – 2023-03-29 (×4): 50 mg via SUBCUTANEOUS
  Filled 2023-03-26 (×4): qty 0.6

## 2023-03-26 MED ORDER — LACTATED RINGERS IV BOLUS
500.0000 mL | Freq: Once | INTRAVENOUS | Status: AC
  Administered 2023-03-26: 500 mL via INTRAVENOUS

## 2023-03-26 MED ORDER — OSELTAMIVIR PHOSPHATE 30 MG PO CAPS
30.0000 mg | ORAL_CAPSULE | Freq: Two times a day (BID) | ORAL | Status: DC
  Administered 2023-03-27: 30 mg via ORAL
  Filled 2023-03-26 (×3): qty 1

## 2023-03-26 MED ORDER — OSELTAMIVIR PHOSPHATE 75 MG PO CAPS
75.0000 mg | ORAL_CAPSULE | Freq: Once | ORAL | Status: AC
  Administered 2023-03-26: 75 mg via ORAL
  Filled 2023-03-26: qty 1

## 2023-03-26 MED ORDER — ONDANSETRON HCL 4 MG PO TABS: 4.0000 mg | ORAL_TABLET | Freq: Four times a day (QID) | ORAL | Status: DC | PRN

## 2023-03-26 MED ORDER — IPRATROPIUM-ALBUTEROL 0.5-2.5 (3) MG/3ML IN SOLN
3.0000 mL | Freq: Once | RESPIRATORY_TRACT | Status: AC
  Administered 2023-03-26: 3 mL via RESPIRATORY_TRACT
  Filled 2023-03-26: qty 3

## 2023-03-26 MED ORDER — SODIUM CHLORIDE 0.9 % IV SOLN
2.0000 g | INTRAVENOUS | Status: AC
  Administered 2023-03-26 – 2023-03-30 (×5): 2 g via INTRAVENOUS
  Filled 2023-03-26 (×7): qty 20

## 2023-03-26 MED ORDER — SODIUM CHLORIDE 0.9 % IV SOLN
500.0000 mg | INTRAVENOUS | Status: DC
  Administered 2023-03-26: 500 mg via INTRAVENOUS
  Filled 2023-03-26 (×2): qty 5

## 2023-03-26 MED ORDER — IPRATROPIUM-ALBUTEROL 0.5-2.5 (3) MG/3ML IN SOLN
3.0000 mL | RESPIRATORY_TRACT | Status: DC | PRN
  Administered 2023-03-26 – 2023-03-27 (×2): 3 mL via RESPIRATORY_TRACT
  Filled 2023-03-26 (×2): qty 3

## 2023-03-26 MED ORDER — ONDANSETRON HCL 4 MG/2ML IJ SOLN: 4.0000 mg | Freq: Four times a day (QID) | INTRAMUSCULAR | Status: DC | PRN

## 2023-03-26 MED ORDER — SODIUM CHLORIDE 0.9 % IV BOLUS
1000.0000 mL | Freq: Once | INTRAVENOUS | Status: AC
  Administered 2023-03-26: 1000 mL via INTRAVENOUS

## 2023-03-26 NOTE — ED Notes (Signed)
Patient's family to desk stating pt is reporting increased SOB. O2 91% on RA. Family requesting O2. Pt placed on 2L [place].

## 2023-03-26 NOTE — Assessment & Plan Note (Signed)
Creatinine 2.07 with GFR in the teens Markedly dry on exam IV fluid hydration Check FENa Hold nephrotoxic agents Renal ultrasound Monitor

## 2023-03-26 NOTE — Assessment & Plan Note (Signed)
 BP stable Titrate home regimen

## 2023-03-26 NOTE — Assessment & Plan Note (Signed)
Stable  Cont asa, statin

## 2023-03-26 NOTE — Assessment & Plan Note (Signed)
 Continue statin.

## 2023-03-26 NOTE — ED Provider Notes (Signed)
[facility] Provider Note    Event Date/Time   First MD Initiated Contact with Patient 03/26/23 1506     (approximate)   History   Shortness of Breath   HPI  John Peterson is a 78 y.o. male who presents with concerns for shortness of breath.  He also has complaints of fatigue and chills.  Symptoms started 4 days ago.  He was exposed to his wife who had the flu after a cruise.  Today the shortness of breath had been getting worse.  He denies any history of lung disease.  In addition he has had poor oral intake during this time. Denies any chest pain.     Physical Exam   Triage Vital Signs: ED Triage Vitals  Encounter Vitals Group     BP 03/26/23 0932 106/60     Systolic BP Percentile --      Diastolic BP Percentile --      Pulse Rate 03/26/23 0932 (!) 107     Resp 03/26/23 0932 (!) 22     Temp 03/26/23 0932 98.2 F (36.8 C)     Temp Source 03/26/23 0932 Oral     SpO2 03/26/23 0932 91 %     Weight 03/26/23 0931 220 lb (99.8 kg)     Height 03/26/23 0931 5\' 10"  (1.778 m)     Head Circumference --      Peak Flow --      Pain Score 03/26/23 0931 2     Pain Loc --      Pain Education --      Exclude from Growth Chart --     Most recent vital signs: Vitals:   03/26/23 0932 03/26/23 1422  BP: 106/60 125/67  Pulse: (!) 107 (!) 111  Resp: (!) 22 20  Temp: 98.2 F (36.8 C) 98 F (36.7 C)  SpO2: 91% 95%   General: Awake, alert, oriented. CV:  Good peripheral perfusion. Tachycardia. Resp:  Increased work of breathing. Absent lung sounds in bottom half of right lung.  Abd:  No distention.    ED Results / Procedures / Treatments   Labs (all labs ordered are listed, but only abnormal results are displayed) Labs Reviewed  RESP PANEL BY RT-PCR (RSV, FLU A&B, COVID)  RVPGX2 - Abnormal; Notable for the following components:      Result Value   Influenza A by PCR POSITIVE (*)    All other components within normal limits  BASIC METABOLIC PANEL  - Abnormal; Notable for the following components:   Sodium 132 (*)    CO2 17 (*)    Glucose, Bld 122 (*)    BUN 55 (*)    Creatinine, Ser 2.07 (*)    GFR, Estimated 32 (*)    Anion gap 16 (*)    All other components within normal limits  CBC     EKG  I, Phineas Semen, attending physician, personally viewed and interpreted this EKG  EKG Time: 0932 Rate: 107 Rhythm: sinus tachycardia Axis: left axis deviation Intervals: qtc 467 QRS: LAFB, RBBB ST changes: no st elevation Impression: abnormal ekg    RADIOLOGY I independently interpreted and visualized the CXR. My interpretation: right sided pleural effusion Radiology interpretation:  IMPRESSION:  Large right base alveolar opacity consistent with layering pleural  effusion.      PROCEDURES:  Critical Care performed: No    MEDICATIONS ORDERED IN ED: Medications - No data to display   IMPRESSION / MDM /  ASSESSMENT AND PLAN / ED COURSE  I reviewed the triage vital signs and the nursing notes.                              Differential diagnosis includes, but is not limited to, influenza, pneumonia, anemia, dehydration  Patient's presentation is most consistent with acute presentation with potential threat to life or bodily function.   The patient is on the cardiac monitor to evaluate for evidence of arrhythmia and/or significant heart rate changes.  Patient presents to the emergency department today with viral signs and symptoms over the past 4 days.  Patient was satting in the low 90s as well as placed on 2 L nasal cannula.  On exam he has absent breath sounds at the right lower leg.  Chest x-ray is concerning for large right-sided pleural effusion.  Additionally patient did test flu positive here.  Furthermore blood work is consistent with acute kidney injury.  I discussed these findings with the patient.  Will start IV fluids.  Discussed with Dr. Alvester Morin with the hospitalist service who will evaluate for  admission.    FINAL CLINICAL IMPRESSION(S) / ED DIAGNOSES   Final diagnoses:  Influenza  Pleural effusion  AKI (acute kidney injury) (HCC)  Shortness of breath     Note:  This document was prepared using Dragon voice recognition software and may include unintentional dictation errors.    Phineas Semen, MD 03/26/23 [phone]

## 2023-03-26 NOTE — Assessment & Plan Note (Addendum)
Influenza A Pleural effusion Decompensated respiratory failure requiring 2 L in the setting of influenza A with noted secondary bronchospasm Chest x-ray concerning for right-sided pleural effusion Procalcitonin 16.57 Some concern for superimposed pneumonia Will check CT of the chest to correlate Tamiflu Continue DuoNebs and supplemental oxygen Will add on superimposed pneumonia coverage predicated on CT of the chest versus tentative plan for thoracentesis Otherwise continue to follow closely

## 2023-03-26 NOTE — Progress Notes (Signed)
PHARMACIST - PHYSICIAN COMMUNICATION  CONCERNING:  Enoxaparin (Lovenox) for DVT Prophylaxis    RECOMMENDATION: Patient was prescribed enoxaprin 40mg  q24 hours for VTE prophylaxis.   Filed Weights   03/26/23 0931  Weight: 99.8 kg (220 lb)    Body mass index is 31.57 kg/m.  Estimated Creatinine Clearance: 34.8 mL/min (A) (by C-G formula based on SCr of 2.07 mg/dL (H)).   Based on [facility] policy patient is candidate for enoxaparin 0.5mg /kg TBW SQ every 24 hours based on BMI being >30.  DESCRIPTION: Pharmacy has adjusted enoxaparin dose per [facility] policy.  Patient is now receiving enoxaparin 50 mg every 24 hours    Barrie Folk, PharmD Clinical Pharmacist  03/26/2023 5:32 PM

## 2023-03-26 NOTE — ED Triage Notes (Signed)
Patient to ED via POV for SOB for the past few days. Started having some CP today- describes as tightness. Wife flu positive. States dyspnea with exertion.

## 2023-03-26 NOTE — H&P (Signed)
History and Physical    Patient: John Peterson ZOX:[id number] DOB: 1945/07/12 DOA: 03/26/2023 DOS: the patient was seen and examined on 03/26/2023 PCP: Gracelyn Nurse, MD  Patient coming from: Home  Chief Complaint:  Chief Complaint  Patient presents with   Shortness of Breath   HPI: John Peterson is a 78 y.o. male with medical history significant of hypertension, hyperlipidemia, GERD, peripheral vascular disease presented with acute respiratory failure with hypoxia, influenza A, pleural effusion.  Patient reports recently taking a cruise with his wife.  Came home this week with progressive onset of shortness of breath, generalized malaise cough and increased work of breathing.  Wife formally diagnosed with influenza by testing at local clinic.  Has had worsening respiratory status over the past 4 to 5 days.  Positive chest tightness and intermittent wheezing.  No prior history of asthma or tobacco use.  Minimal orthopnea or PND. Presented to the ER afebrile, heart rate 100s, BP stable.  Satting in upper 80s to low 90s on room air, transition to 2 L nasal cannula to keep O2 sats greater than 95%.  White count 8.8, hemoglobin 14.9, platelets 251, creatinine 2.07.  Influenza A positive.  Procalcitonin 16.57. CXR w/ Large right base alveolar opacity consistent with layering pleural effusion Review of Systems: As mentioned in the history of present illness. All other systems reviewed and are negative. Past Medical History:  Diagnosis Date   Allergy    Arthritis    Cancer (HCC)    CTCL   GERD (gastroesophageal reflux disease)    Hx of dysplastic nevus 06/30/2009   L mid back, mild atypia   Hx of dysplastic nevus 09/23/2014   L posterior deltoid, moderate to severe atypia, excised   Hx of dysplastic nevus 09/14/2017   R lateral foot, mild atypia   Hyperlipidemia    Hypertension    Peripheral vascular disease (HCC)    Past Surgical History:  Procedure Laterality Date   HYDROCELE  EXCISION  11/1998   JOINT REPLACEMENT Left 03/06/2013   L Knee Medial Compartment Makoplasty   JOINT REPLACEMENT Left 03/02/2016   L Knee Unicondylar converted to Total Replacement Mako   MOLE REMOVAL  07/2017   TOTAL KNEE ARTHROPLASTY Right 04/02/2019   Procedure: TOTAL KNEE ARTHROPLASTY;  Surgeon: Christena Flake, MD;  Location: [facility] ORS;  Service: Orthopedics;  Laterality: Right;   Social History:  reports that he quit smoking about 57 years ago. His smoking use included cigarettes. He has never used smokeless tobacco. He reports current alcohol use of about 7.0 standard drinks of alcohol per week. He reports that he does not use drugs.  Allergies  Allergen Reactions   Tramadol Other (See Comments)    PATIENT PREFERS TO AVOID ALL OPOIDS    History reviewed. No pertinent family history.  Prior to Admission medications   Medication Sig Start Date End Date Taking? Authorizing Provider  acetaminophen (TYLENOL) 500 MG tablet Take 1,000 mg by mouth daily.    [provider]  allopurinol (ZYLOPRIM) 100 MG tablet Take 100 mg by mouth daily. 05/25/16   [provider]  amLODipine (NORVASC) 10 MG tablet Take 10 mg by mouth daily.  03/21/16   [provider]  amoxicillin (AMOXIL) 500 MG capsule Take 1,000 mg by mouth See admin instructions. TAKE 2 CAPSULES (1000 MG) BY MOUTH 1 HOUR PRIOR TO DENTAL APPOINTMENTS    [provider]  atorvastatin (LIPITOR) 10 MG tablet Take 10 mg by mouth daily.  03/28/16   [provider]  azelastine (OPTIVAR) 0.05 % ophthalmic solution Place 1 drop into both eyes 2 (two) times daily as needed (dry eyes.). Patient not taking: Reported on 06/11/2021    [provider]  Ca Carbonate-Mag Hydroxide (ROLAIDS PO) Take 1 tablet by mouth at bedtime.    [provider]  celecoxib (CELEBREX) 200 MG capsule Take by mouth. 04/04/19   [provider]  cyclobenzaprine (FLEXERIL) 10 MG tablet Take 10 mg by mouth 3  (three) times daily as needed for muscle spasms.    [provider]  desonide (DESOWEN) 0.05 % cream Apply to aa's QD-BID PRN. 10/20/22   Deirdre Evener, MD  famotidine (PEPCID) 20 MG tablet Take 20 mg by mouth at bedtime.  07/06/10   [provider]  finasteride (PROSCAR) 5 MG tablet Take by mouth. Patient not taking: Reported on 10/20/2022 04/05/21   [provider]  fluticasone (FLONASE) 50 MCG/ACT nasal spray Place 1 spray into both nostrils daily.    [provider]  ketoconazole (NIZORAL) 2 % cream Apply topically daily. APPLIED TO FEET 10/20/22   Deirdre Evener, MD  lisinopril (PRINIVIL,ZESTRIL) 40 MG tablet Take 40 mg by mouth daily. 03/11/16   [provider]  loratadine (CLARITIN) 10 MG tablet Take 10 mg by mouth daily.  07/06/10   [provider]  Multiple Vitamins-Minerals (MULTIVITAMIN WITH MINERALS) tablet Take 1 tablet by mouth daily.     [provider]  tamsulosin (FLOMAX) 0.4 MG CAPS capsule Take 0.4 mg by mouth daily after supper.  05/03/17   [provider]    Physical Exam: Vitals:   03/26/23 0931 03/26/23 0932 03/26/23 1422 03/26/23 1730  BP:  106/60 125/67 111/66  Pulse:  (!) 107 (!) 111 (!) 116  Resp:  (!) 22 20   Temp:  98.2 F (36.8 C) 98 F (36.7 C)   TempSrc:  Oral    SpO2:  91% 95% 91%  Weight: 99.8 kg     Height: 5\' 10"  (1.778 m)      Physical Exam Constitutional:      Appearance: He is obese.  HENT:     Head: Normocephalic and atraumatic.     Nose: Nose normal.     Mouth/Throat:     Mouth: Mucous membranes are dry.  Eyes:     Extraocular Movements: Extraocular movements intact.     Pupils: Pupils are equal, round, and reactive to light.  Cardiovascular:     Rate and Rhythm: Regular rhythm. Tachycardia present.  Pulmonary:     Effort: Pulmonary effort is normal.  Abdominal:     General: Bowel sounds are normal.  Musculoskeletal:        General: Normal range of motion.      Cervical back: Normal range of motion.  Skin:    General: Skin is dry.  Neurological:     General: No focal deficit present.  Psychiatric:        Mood and Affect: Mood normal.     Data Reviewed:  There are no new results to review at this time.  DG Chest 2 View CLINICAL DATA:  SOB  EXAM: CHEST - 2 VIEW  COMPARISON:  None Available.  FINDINGS: Right basilar opacity consistent with a large layering pleural effusion. Linear subsegmental atelectasis at the left base. Normal pulmonary vasculature. No pneumothorax. Unremarkable cardiac silhouette. Calcified aorta.  IMPRESSION: Large right base alveolar opacity consistent with layering pleural effusion.  Electronically Signed  By: Layla Maw M.D.   On: 03/26/2023 09:55  Lab Results  Component Value Date   WBC 8.8 03/26/2023   HGB 14.9 03/26/2023   HCT 42.5 03/26/2023   MCV 92.8 03/26/2023   PLT 251 03/26/2023   Last metabolic panel Lab Results  Component Value Date   GLUCOSE 122 (H) 03/26/2023   NA 132 (L) 03/26/2023   K 3.8 03/26/2023   CL 99 03/26/2023   CO2 17 (L) 03/26/2023   BUN 55 (H) 03/26/2023   CREATININE 2.07 (H) 03/26/2023   GFRNONAA 32 (L) 03/26/2023   CALCIUM 9.0 03/26/2023   PROT 7.3 01/14/2019   ALBUMIN 4.2 01/14/2019   BILITOT 0.6 01/14/2019   ALKPHOS 67 01/14/2019   AST 18 01/14/2019   ALT 15 01/14/2019   ANIONGAP 16 (H) 03/26/2023    Assessment and Plan: * Acute respiratory failure with hypoxia (HCC) Influenza A Pleural effusion Decompensated respiratory failure requiring 2 L in the setting of influenza A with noted secondary bronchospasm Chest x-ray concerning for right-sided pleural effusion Procalcitonin 16.57 Some concern for superimposed pneumonia Will check CT of the chest to correlate Tamiflu Continue DuoNebs and supplemental oxygen Will add on superimposed pneumonia coverage predicated on CT of the chest versus tentative plan for thoracentesis Otherwise continue to  follow closely  PVD (peripheral vascular disease) (HCC) Stable  Cont asa, statin    Hyperlipidemia Continue statin  Essential hypertension, benign BP stable Titrate home regimen      Advance Care Planning:   Code Status: Full Code   Consults: None   Family Communication: No family at the bedside   Severity of Illness: The appropriate patient status for this patient is INPATIENT. Inpatient status is judged to be reasonable and necessary in order to provide the required intensity of service to ensure the patient's safety. The patient's presenting symptoms, physical exam findings, and initial radiographic and laboratory data in the context of their chronic comorbidities is felt to place them at high risk for further clinical deterioration. Furthermore, it is not anticipated that the patient will be medically stable for discharge from the hospital within 2 midnights of admission.   * I certify that at the point of admission it is my clinical judgment that the patient will require inpatient hospital care spanning beyond 2 midnights from the point of admission due to high intensity of service, high risk for further deterioration and high frequency of surveillance required.*  Author: Floydene Flock, MD 03/26/2023 5:40 PM  For on call review [email or web address].

## 2023-03-27 ENCOUNTER — Inpatient Hospital Stay: Payer: Medicare PPO

## 2023-03-27 ENCOUNTER — Encounter: Payer: Self-pay | Admitting: Family Medicine

## 2023-03-27 DIAGNOSIS — J90 Pleural effusion, not elsewhere classified: Secondary | ICD-10-CM

## 2023-03-27 DIAGNOSIS — A419 Sepsis, unspecified organism: Secondary | ICD-10-CM

## 2023-03-27 DIAGNOSIS — J09X2 Influenza due to identified novel influenza A virus with other respiratory manifestations: Secondary | ICD-10-CM

## 2023-03-27 DIAGNOSIS — J9602 Acute respiratory failure with hypercapnia: Secondary | ICD-10-CM

## 2023-03-27 DIAGNOSIS — N179 Acute kidney failure, unspecified: Secondary | ICD-10-CM

## 2023-03-27 DIAGNOSIS — G9341 Metabolic encephalopathy: Secondary | ICD-10-CM

## 2023-03-27 DIAGNOSIS — J9601 Acute respiratory failure with hypoxia: Secondary | ICD-10-CM | POA: Diagnosis not present

## 2023-03-27 LAB — COMPREHENSIVE METABOLIC PANEL
ALT: 32 U/L (ref 0–44)
AST: 63 U/L — ABNORMAL HIGH (ref 15–41)
Albumin: 2.4 g/dL — ABNORMAL LOW (ref 3.5–5.0)
Alkaline Phosphatase: 65 U/L (ref 38–126)
Anion gap: 15 (ref 5–15)
BUN: 68 mg/dL — ABNORMAL HIGH (ref 8–23)
CO2: 19 mmol/L — ABNORMAL LOW (ref 22–32)
Calcium: 8.5 mg/dL — ABNORMAL LOW (ref 8.9–10.3)
Chloride: 101 mmol/L (ref 98–111)
Creatinine, Ser: 2.12 mg/dL — ABNORMAL HIGH (ref 0.61–1.24)
GFR, Estimated: 31 mL/min — ABNORMAL LOW (ref >60)
Glucose, Bld: 90 mg/dL (ref 70–99)
Potassium: 4.2 mmol/L (ref 3.5–5.1)
Sodium: 135 mmol/L (ref 135–145)
Total Bilirubin: 1 mg/dL (ref 0.0–1.2)
Total Protein: 5.9 g/dL — ABNORMAL LOW (ref 6.5–8.1)

## 2023-03-27 LAB — CBC
HCT: 44.3 % (ref 39.0–52.0)
Hemoglobin: 14.6 g/dL (ref 13.0–17.0)
MCH: 31.9 pg (ref 26.0–34.0)
MCHC: 33 g/dL (ref 30.0–36.0)
MCV: 96.9 fL (ref 80.0–100.0)
Platelets: 251 10*3/uL (ref 150–400)
RBC: 4.57 MIL/uL (ref 4.22–5.81)
RDW: 14.6 % (ref 11.5–15.5)
WBC: 15.8 10*3/uL — ABNORMAL HIGH (ref 4.0–10.5)
nRBC: 0 % (ref 0.0–0.2)

## 2023-03-27 LAB — HIV ANTIBODY (ROUTINE TESTING W REFLEX): HIV Screen 4th Generation wRfx: NONREACTIVE

## 2023-03-27 LAB — BLOOD GAS, VENOUS
Acid-base deficit: 5.5 mmol/L — ABNORMAL HIGH (ref 0.0–2.0)
Bicarbonate: 23.7 mmol/L (ref 20.0–28.0)
O2 Saturation: 69.9 %
Patient temperature: 37
pCO2, Ven: 62 mm[Hg] — ABNORMAL HIGH (ref 44–60)
pH, Ven: 7.19 — CL (ref 7.25–7.43)
pO2, Ven: 46 mm[Hg] — ABNORMAL HIGH (ref 32–45)

## 2023-03-27 LAB — TROPONIN I (HIGH SENSITIVITY)
Troponin I (High Sensitivity): 44 ng/L — ABNORMAL HIGH (ref <18)
Troponin I (High Sensitivity): 56 ng/L — ABNORMAL HIGH (ref <18)

## 2023-03-27 LAB — STREP PNEUMONIAE URINARY ANTIGEN: Strep Pneumo Urinary Antigen: POSITIVE — AB

## 2023-03-27 LAB — GLUCOSE, CAPILLARY
Glucose-Capillary: 64 mg/dL — ABNORMAL LOW (ref 70–99)
Glucose-Capillary: 67 mg/dL — ABNORMAL LOW (ref 70–99)
Glucose-Capillary: 70 mg/dL (ref 70–99)
Glucose-Capillary: 82 mg/dL (ref 70–99)
Glucose-Capillary: 94 mg/dL (ref 70–99)
Glucose-Capillary: 98 mg/dL (ref 70–99)

## 2023-03-27 LAB — LACTIC ACID, PLASMA
Lactic Acid, Venous: 2.1 mmol/L (ref 0.5–1.9)
Lactic Acid, Venous: 2 mmol/L (ref 0.5–1.9)

## 2023-03-27 LAB — MRSA NEXT GEN BY PCR, NASAL: MRSA by PCR Next Gen: NOT DETECTED

## 2023-03-27 LAB — BRAIN NATRIURETIC PEPTIDE: B Natriuretic Peptide: 257.7 pg/mL — ABNORMAL HIGH (ref 0.0–100.0)

## 2023-03-27 LAB — PROCALCITONIN: Procalcitonin: 47.46 ng/mL

## 2023-03-27 MED ORDER — FENTANYL CITRATE PF 50 MCG/ML IJ SOSY
100.0000 ug | PREFILLED_SYRINGE | Freq: Once | INTRAMUSCULAR | Status: AC
  Administered 2023-03-27: 100 ug via INTRAVENOUS

## 2023-03-27 MED ORDER — LACTATED RINGERS IV BOLUS
1000.0000 mL | Freq: Once | INTRAVENOUS | Status: AC
  Administered 2023-03-27: 1000 mL via INTRAVENOUS

## 2023-03-27 MED ORDER — POLYETHYLENE GLYCOL 3350 17 G PO PACK
17.0000 g | PACK | Freq: Every day | ORAL | Status: DC
  Administered 2023-03-27: 17 g
  Filled 2023-03-27: qty 1

## 2023-03-27 MED ORDER — VITAL HIGH PROTEIN PO LIQD
1000.0000 mL | ORAL | Status: DC
  Administered 2023-03-27: 1000 mL

## 2023-03-27 MED ORDER — ACETAMINOPHEN 160 MG/5ML PO SOLN
650.0000 mg | ORAL | Status: DC | PRN
  Administered 2023-03-27: 650 mg
  Filled 2023-03-27 (×4): qty 20.3

## 2023-03-27 MED ORDER — ETOMIDATE 2 MG/ML IV SOLN
INTRAVENOUS | Status: AC
  Administered 2023-03-27: 10 mg via INTRAVENOUS
  Filled 2023-03-27: qty 10

## 2023-03-27 MED ORDER — INSULIN ASPART 100 UNIT/ML IJ SOLN: 2.0000 [IU] | INTRAMUSCULAR | Status: DC

## 2023-03-27 MED ORDER — ORAL CARE MOUTH RINSE
15.0000 mL | OROMUCOSAL | Status: DC
  Administered 2023-03-27 – 2023-03-28 (×13): 15 mL via OROMUCOSAL
  Filled 2023-03-27 (×4): qty 15

## 2023-03-27 MED ORDER — PROSOURCE TF20 ENFIT COMPATIBL EN LIQD: 60.0000 mL | Freq: Every day | ENTERAL | Status: DC

## 2023-03-27 MED ORDER — FENTANYL 2500MCG IN NS 250ML (10MCG/ML) PREMIX INFUSION
INTRAVENOUS | Status: AC
  Filled 2023-03-27: qty 250

## 2023-03-27 MED ORDER — FENTANYL BOLUS VIA INFUSION
25.0000 ug | INTRAVENOUS | Status: DC | PRN
  Administered 2023-03-28: 25 ug via INTRAVENOUS

## 2023-03-27 MED ORDER — DEXTROSE 50 % IV SOLN
12.5000 g | INTRAVENOUS | Status: AC
  Administered 2023-03-27: 12.5 g via INTRAVENOUS
  Filled 2023-03-27: qty 50

## 2023-03-27 MED ORDER — NOREPINEPHRINE 4 MG/250ML-% IV SOLN
INTRAVENOUS | Status: AC
  Administered 2023-03-27: 5 ug/min via INTRAVENOUS
  Filled 2023-03-27: qty 250

## 2023-03-27 MED ORDER — DOCUSATE SODIUM 50 MG/5ML PO LIQD
100.0000 mg | Freq: Two times a day (BID) | ORAL | Status: DC
  Administered 2023-03-27 (×2): 100 mg
  Filled 2023-03-27 (×2): qty 10

## 2023-03-27 MED ORDER — FENTANYL CITRATE (PF) 100 MCG/2ML IJ SOLN
INTRAMUSCULAR | Status: AC
  Filled 2023-03-27: qty 2

## 2023-03-27 MED ORDER — SODIUM CHLORIDE 0.9 % IV SOLN: 250.0000 mL | INTRAVENOUS | Status: AC

## 2023-03-27 MED ORDER — OSELTAMIVIR PHOSPHATE 30 MG PO CAPS
30.0000 mg | ORAL_CAPSULE | Freq: Two times a day (BID) | ORAL | Status: DC
  Administered 2023-03-27: 30 mg
  Filled 2023-03-27 (×2): qty 1

## 2023-03-27 MED ORDER — ORAL CARE MOUTH RINSE: 15.0000 mL | OROMUCOSAL | Status: DC | PRN

## 2023-03-27 MED ORDER — FENTANYL 2500MCG IN NS 250ML (10MCG/ML) PREMIX INFUSION
0.0000 ug/h | INTRAVENOUS | Status: DC
  Administered 2023-03-27: 25 ug/h via INTRAVENOUS
  Administered 2023-03-28: 125 ug/h via INTRAVENOUS
  Filled 2023-03-27: qty 250

## 2023-03-27 MED ORDER — SODIUM CHLORIDE 0.9 % IV SOLN
500.0000 mg | INTRAVENOUS | Status: DC
  Administered 2023-03-27 – 2023-03-28 (×2): 500 mg via INTRAVENOUS
  Filled 2023-03-27 (×2): qty 5

## 2023-03-27 MED ORDER — PROPOFOL 1000 MG/100ML IV EMUL
0.0000 ug/kg/min | INTRAVENOUS | Status: DC
  Administered 2023-03-27: 5 ug/kg/min via INTRAVENOUS
  Filled 2023-03-27: qty 100

## 2023-03-27 MED ORDER — PANTOPRAZOLE SODIUM 40 MG IV SOLR
40.0000 mg | Freq: Every day | INTRAVENOUS | Status: DC
  Administered 2023-03-27: 40 mg via INTRAVENOUS
  Filled 2023-03-27: qty 10

## 2023-03-27 MED ORDER — MIDAZOLAM HCL 2 MG/2ML IJ SOLN
INTRAMUSCULAR | Status: AC
  Filled 2023-03-27: qty 4

## 2023-03-27 MED ORDER — CHLORHEXIDINE GLUCONATE CLOTH 2 % EX PADS
6.0000 | MEDICATED_PAD | Freq: Every day | CUTANEOUS | Status: DC
  Administered 2023-03-27 – 2023-04-03 (×7): 6 via TOPICAL

## 2023-03-27 MED ORDER — ETOMIDATE 2 MG/ML IV SOLN: 10.0000 mg | Freq: Once | INTRAVENOUS | Status: AC

## 2023-03-27 MED ORDER — FENTANYL BOLUS VIA INFUSION
25.0000 ug | INTRAVENOUS | Status: DC | PRN
  Administered 2023-03-27 (×2): 25 ug via INTRAVENOUS

## 2023-03-27 MED ORDER — MIDAZOLAM HCL 2 MG/2ML IJ SOLN
2.0000 mg | INTRAMUSCULAR | Status: DC | PRN
  Administered 2023-03-27: 2 mg via INTRAVENOUS
  Filled 2023-03-27: qty 2

## 2023-03-27 MED ORDER — FENTANYL CITRATE (PF) 100 MCG/2ML IJ SOLN: 100.0000 ug | Freq: Once | INTRAMUSCULAR | Status: DC

## 2023-03-27 MED ORDER — THIAMINE MONONITRATE 100 MG PO TABS
100.0000 mg | ORAL_TABLET | Freq: Every day | ORAL | Status: DC
  Administered 2023-03-27: 100 mg
  Filled 2023-03-27: qty 1

## 2023-03-27 MED ORDER — MIDAZOLAM HCL 2 MG/2ML IJ SOLN: 4.0000 mg | INTRAMUSCULAR | Status: DC | PRN

## 2023-03-27 MED ORDER — MIDAZOLAM HCL 2 MG/2ML IJ SOLN: 4.0000 mg | Freq: Once | INTRAMUSCULAR | Status: DC

## 2023-03-27 MED ORDER — FREE WATER
30.0000 mL | Status: DC
  Administered 2023-03-27 – 2023-03-28 (×4): 30 mL

## 2023-03-27 MED ORDER — ADULT MULTIVITAMIN LIQUID CH
15.0000 mL | Freq: Every day | ORAL | Status: DC
  Filled 2023-03-27: qty 15

## 2023-03-27 MED ORDER — FOLIC ACID 1 MG PO TABS
1.0000 mg | ORAL_TABLET | Freq: Every day | ORAL | Status: DC
Start: 2023-03-27 — End: 2023-03-28
  Administered 2023-03-27: 1 mg
  Filled 2023-03-27: qty 1

## 2023-03-27 MED ORDER — GUAIFENESIN-DM 100-10 MG/5ML PO SYRP
10.0000 mL | ORAL_SOLUTION | Freq: Once | ORAL | Status: AC
  Administered 2023-03-27: 10 mL via ORAL
  Filled 2023-03-27: qty 10

## 2023-03-27 MED ORDER — NOREPINEPHRINE 4 MG/250ML-% IV SOLN
2.0000 ug/min | INTRAVENOUS | Status: DC
  Administered 2023-03-27: 6 ug/min via INTRAVENOUS
  Filled 2023-03-27: qty 250

## 2023-03-27 MED ORDER — FENTANYL CITRATE PF 50 MCG/ML IJ SOSY
PREFILLED_SYRINGE | INTRAMUSCULAR | Status: AC
  Filled 2023-03-27: qty 2

## 2023-03-27 MED ORDER — ROCURONIUM BROMIDE 10 MG/ML (PF) SYRINGE: 80.0000 mg | PREFILLED_SYRINGE | Freq: Once | INTRAVENOUS | Status: AC

## 2023-03-27 MED ORDER — ROCURONIUM BROMIDE 10 MG/ML (PF) SYRINGE
PREFILLED_SYRINGE | INTRAVENOUS | Status: AC
  Administered 2023-03-27: 80 mg via INTRAVENOUS
  Filled 2023-03-27: qty 10

## 2023-03-27 NOTE — Consult Note (Signed)
NAME:  John Peterson, MRN:  [id number], DOB:  07-30-1945, LOS: 1 ADMISSION DATE:  03/26/2023, CONSULTATION DATE: 03/27/2023 REFERRING MD: Arville Care, NP REASON FOR CONSULT: Acute respiratory distress  HPI  78 y.o male with significant PMH of CTCL (cutaneous T-cell cell lymphoma), hyperlipidemia, hypertension, seizure, right TKA, PVD, and GERD who presented to the ED with chief complaints of progressive shortness of breath.  Per ED reports and admit notes, patient recently came back from a cruise with his wife who tested positive for flu. Patient developed progressive shortness of breath, generalized malaise, cough and increased work of breathing this week. Symptoms worsened over the past 4 to 5 days prior to presenting to the ED   ED Course: Initial vital signs showed HR of 112 beats/minute, BP mm Hg, the RR 44 breaths/minute, and the oxygen saturation 94% on and a temperature of 98.29F (36.0C).  Pertinent Labs/Diagnostics Findings: Na+/ K+: 132/3.8.  Glucose: 122.  BUN/Cr.:  55/2.07.  CO2 17, anion gap 16  WBC: 8.8~15.8 PCT: 16.57 COVID PCR: Negative, influenza A positive VBG: pO2 46; pCO2 62; pH 7.19;  HCO3 23.7, %O2 Sat 69.9.  CXR> CTA Chest> SEE RESULT Medication administered in the ED: Patient given gentle fluids due to pulmonary edema, IV Lasix and started on broad-spectrum antibiotics Ceftriaxone and Azithromycin for sepsis secondary to pneumonia. Disposition: Admitted to [facility] service. Patient became hypoxic in the mid 80's on 6L [place], tachypneic, tachycardic with increased work of breathing. He was placed on BiPAP with no improvement. PCCM consulted and patient intubated.  Past Medical History  CTCL (cutaneous T-cell cell lymphoma), hyperlipidemia, hypertension, seizure, right TKA, PVD, and GERD   Significant Hospital Events   2/16: Admitted to our services acute hypoxic hypercapnic respiratory failure secondary to influenza A bronchopneumonia and right pleural effusion. 2/17: Failed  BiPAP requiring intubation.  PCCM consulted  Consults:  PCCM IR  Procedures:  2/17: Intubation  Significant Diagnostic Tests:  02/16: Chest Xray> IMPRESSION: Large right base alveolar opacity consistent with layering pleural effusion.  02/16: CTA Chest> IMPRESSION: 1. Complete consolidation of the right lower lobe with air bronchograms, compatible with pneumonia. 2. Small-moderate right pleural effusion. 3. Several borderline enlarged mediastinal lymph nodes, likely reactive. 4. Aortic and coronary artery atherosclerosis (ICD10-I70.0).  Interim History / Subjective:    -intubated and sedated  Micro Data:  2/16: SARS-CoV-2 PCR> negative 2/16: Influenza PCR> negative 2/16: Blood culture x2> 2/17: MRSA PCR>>  2/17: Strep pneumo urinary antigen> 2/17: Legionella urinary antigen>  Antimicrobials:  Azithromycin 2/16>> Ceftriaxone 2/16>>   OBJECTIVE  Blood pressure 118/63, pulse (!) 110, temperature 98.5 F (36.9 C), temperature source Oral, resp. rate (!) 40, height 5\' 10"  (1.778 m), weight 99.8 kg, SpO2 (!) 88%.      Intake/Output Summary (Last 24 hours) at 03/27/2023 0551 Last data filed at 03/26/2023 2331 Gross per 24 hour  Intake 683.33 ml  Output --  Net 683.33 ml   Filed Weights   03/26/23 0931  Weight: 99.8 kg    Physical Examination  GENERAL: 78 year-old critically ill patient lying in the bed intubated and sedated EYES: PEERLA. No scleral icterus. Extraocular muscles intact.  HEENT: Head atraumatic, normocephalic. Oropharynx and nasopharynx clear.  NECK:  No JVD, supple  LUNGS: Decreased breath sounds bilaterally.  No use of accessory muscles of respiration.  CARDIOVASCULAR: S1, S2 normal. No murmurs, rubs, or gallops.  ABDOMEN: Soft, NTND EXTREMITIES: +pitting edema of bilateral lower extremities.  Capillary refill < 3 seconds in all extremities. Pulses  palpable distally. NEUROLOGIC: The patient is intubated and sedated . No focal neurological  deficit appreciated. Cranial nerves are intact.  SKIN: No obvious rash, lesion, or ulcer. Warm to touch Labs/imaging that I havepersonally reviewed  (right click and "Reselect all SmartList Selections" daily)     Labs   CBC: Recent Labs  Lab 03/26/23 0934 03/27/23 0512  WBC 8.8 15.8*  HGB 14.9 14.6  HCT 42.5 44.3  MCV 92.8 96.9  PLT 251 251    Basic Metabolic Panel: Recent Labs  Lab 03/26/23 0934 03/27/23 0512  NA 132* 135  K 3.8 4.2  CL 99 101  CO2 17* 19*  GLUCOSE 122* 90  BUN 55* 68*  CREATININE 2.07* 2.12*  CALCIUM 9.0 8.5*   GFR: Estimated Creatinine Clearance: 34 mL/min (A) (by C-G formula based on SCr of 2.12 mg/dL (H)). Recent Labs  Lab 03/26/23 0934 03/27/23 0512  PROCALCITON 16.57  --   WBC 8.8 15.8*    Liver Function Tests: Recent Labs  Lab 03/27/23 0512  AST 63*  ALT 32  ALKPHOS 65  BILITOT 1.0  PROT 5.9*  ALBUMIN 2.4*   No results for input(s): "LIPASE", "AMYLASE" in the last 168 hours. No results for input(s): "AMMONIA" in the last 168 hours.  ABG    Component Value Date/Time   HCO3 23.7 03/27/2023 0512   ACIDBASEDEF 5.5 (H) 03/27/2023 0512   O2SAT 69.9 03/27/2023 0512     Coagulation Profile: No results for input(s): "INR", "PROTIME" in the last 168 hours.  Cardiac Enzymes: No results for input(s): "CKTOTAL", "CKMB", "CKMBINDEX", "TROPONINI" in the last 168 hours.  HbA1C: No results found for: "HGBA1C"  CBG: No results for input(s): "GLUCAP" in the last 168 hours.  Review of Systems:   Unable to be obtained secondary to the patient's intubated and sedated status.   Past Medical History  He,  has a past medical history of Allergy, Arthritis, Cancer (HCC), GERD (gastroesophageal reflux disease), dysplastic nevus (06/30/2009), dysplastic nevus (09/23/2014), dysplastic nevus (09/14/2017), Hyperlipidemia, Hypertension, and Peripheral vascular disease (HCC).   Surgical History    Past Surgical History:  Procedure  Laterality Date   HYDROCELE EXCISION  11/1998   JOINT REPLACEMENT Left 03/06/2013   L Knee Medial Compartment Makoplasty   JOINT REPLACEMENT Left 03/02/2016   L Knee Unicondylar converted to Total Replacement Mako   MOLE REMOVAL  07/2017   TOTAL KNEE ARTHROPLASTY Right 04/02/2019   Procedure: TOTAL KNEE ARTHROPLASTY;  Surgeon: Christena Flake, MD;  Location: [facility] ORS;  Service: Orthopedics;  Laterality: Right;     Social History   reports that he quit smoking about 57 years ago. His smoking use included cigarettes. He has never used smokeless tobacco. He reports current alcohol use of about 7.0 standard drinks of alcohol per week. He reports that he does not use drugs.   Family History   His family history is not on file.   Allergies Allergies  Allergen Reactions   Tramadol Other (See Comments)    PATIENT PREFERS TO AVOID ALL OPOIDS   Home Medications  Prior to Admission medications   Medication Sig Start Date End Date Taking? Authorizing Provider  allopurinol (ZYLOPRIM) 100 MG tablet Take 100 mg by mouth daily. 05/25/16  Yes [provider]  amLODipine (NORVASC) 10 MG tablet Take 10 mg by mouth daily.  03/21/16  Yes [provider]  atorvastatin (LIPITOR) 10 MG tablet Take 10 mg by mouth daily.  03/28/16  Yes [provider]  celecoxib (  CELEBREX) 200 MG capsule Take by mouth. 04/04/19  Yes [provider]  lisinopril (PRINIVIL,ZESTRIL) 40 MG tablet Take 40 mg by mouth daily. 03/11/16  Yes [provider]  tamsulosin (FLOMAX) 0.4 MG CAPS capsule Take 0.4 mg by mouth daily after supper.  05/03/17  Yes [provider]  acetaminophen (TYLENOL) 500 MG tablet Take 1,000 mg by mouth daily.    [provider]  amoxicillin (AMOXIL) 500 MG capsule Take 1,000 mg by mouth See admin instructions. TAKE 2 CAPSULES (1000 MG) BY MOUTH 1 HOUR PRIOR TO DENTAL APPOINTMENTS Patient not taking: Reported on 03/27/2023    [provider]   azelastine (OPTIVAR) 0.05 % ophthalmic solution Place 1 drop into both eyes 2 (two) times daily as needed (dry eyes.). Patient not taking: Reported on 06/11/2021    [provider]  Ca Carbonate-Mag Hydroxide (ROLAIDS PO) Take 1 tablet by mouth at bedtime.    [provider]  cyclobenzaprine (FLEXERIL) 10 MG tablet Take 10 mg by mouth 3 (three) times daily as needed for muscle spasms. Patient not taking: Reported on 03/27/2023    [provider]  desonide (DESOWEN) 0.05 % cream Apply to aa's QD-BID PRN. 10/20/22   Deirdre Evener, MD  famotidine (PEPCID) 20 MG tablet Take 20 mg by mouth at bedtime.  07/06/10   [provider]  finasteride (PROSCAR) 5 MG tablet Take by mouth. Patient not taking: Reported on 10/20/2022 04/05/21   [provider]  fluticasone (FLONASE) 50 MCG/ACT nasal spray Place 1 spray into both nostrils daily.    [provider]  ketoconazole (NIZORAL) 2 % cream Apply topically daily. APPLIED TO FEET 10/20/22   Deirdre Evener, MD  loratadine (CLARITIN) 10 MG tablet Take 10 mg by mouth daily.  07/06/10   [provider]  Multiple Vitamins-Minerals (MULTIVITAMIN WITH MINERALS) tablet Take 1 tablet by mouth daily.     [provider]  Scheduled Meds:  docusate  100 mg Per Tube BID   enoxaparin (LOVENOX) injection  50 mg Subcutaneous Q24H   fentaNYL       oseltamivir  30 mg Oral BID   polyethylene glycol  17 g Per Tube Daily   Continuous Infusions:  sodium chloride Stopped (03/27/23 8295)   azithromycin Stopped (03/26/23 2331)   cefTRIAXone (ROCEPHIN)  IV Stopped (03/26/23 2220)   fentaNYL     norepinephrine (LEVOPHED) Adult infusion 9 mcg/min (03/27/23 0636)   propofol (DIPRIVAN) infusion     PRN Meds:.fentaNYL, fentaNYL, ipratropium-albuterol, ondansetron **OR** ondansetron (ZOFRAN) IV  Active Hospital Problem list   See systems below  Assessment & Plan:  #Acute Hypoxic and Hypercapnic Respiratory  Failure Secondary to: #Influenza type A and superimposed  Right Bronchopneumonia  #Right Pleural Effusion  -full mechanical support 6-8cc/kg/Vt  -titrate FiO2, PEEP to maintain O2 sat >90%  -Lung protective ventilation  -PRN Chest X-ray & ABG -PRN and scheduled bronchodilators -SAT/SBT when appropriate  -IR consult for thoracentesis   #Sepsis  secondary to influenza A and Right Bronchopneumonia  -F/u cultures, trend lactic/ PCT -Monitor WBC/ fever curve -IV antibiotics: Ceftriaxone and azithromycin -Continue Tamiflu -IVF hydration as needed -Obtain tracheal aspirate, check strep pneumo and Legionella antigen -Pressors for MAP goal >65 -Strict I/O's    #AKI with cr. 2.12 mg/dL from a baseline of 6.21 mg/dL likely secondary to septic ATN.   #AGMA #Hyponatremia -Trend Lactate -Monitor I&O's / urinary output -Follow BMP -Ensure adequate renal perfusion -Avoid nephrotoxic agents as able -Replace electrolytes as  indicated    #Sedation needs in setting of mechanical ventilation #Acute Metabolic Encephalopathy -Avoid sedating medications as able -Daily wake up assessment -prn fentanyl, prop for RASS -1   #HTN #HLD -Amlodipine in the setting of sedation related hypotension -Continue atorvastatin  Best practice:  Diet:  Tube Feed  Pain/Anxiety/Delirium protocol (if indicated): Yes (RASS goal -1) VAP protocol (if indicated): Yes DVT prophylaxis: LMWH GI prophylaxis: H2B Glucose control:  SSI No Central venous access:  N/A Arterial line:  N/A Foley:  Yes, and it is still needed Mobility:  bed rest  PT consulted: N/A Last date of multidisciplinary goals of care discussion [updated significant other of change in status] Code Status:  full code Disposition: ICU   = Goals of Care = Code Status Order: FULL  Primary Emergency Contact: Daisey Must, Home Phone: [phone] Wishes to pursue full aggressive treatment and intervention options, including CPR and intubation,    Critical care time: 45 minutes        Webb Silversmith DNP, CCRN, FNP-C, AGACNP-BC Acute Care & Family Nurse Practitioner [facility] PCCM on call pager [phone]

## 2023-03-27 NOTE — Procedures (Addendum)
INTUBATION PROCEDURE NOTE  John Peterson  [id number]  August 01, 1945  Date:03/27/23  Time:6:31 AM   Provider Performing:Freda Jaquith A Yomira Flitton   Procedure: Intubation (31500)  Indication(s) Respiratory Failure  Consent Unable to obtain consent due to emergent nature of procedure.  Anesthesia Etomidate, Fentanyl, and Rocuronium  Time Out Verified patient identification, verified procedure, site/side was marked, verified correct patient position, special equipment/implants available, medications/allergies/relevant history reviewed, required imaging and test results available.  Sterile Technique Usual hand hygeine, masks, and gloves were used  Procedure Description Patient positioned in bed supine.  Sedation given as noted above.  Patient was intubated with endotracheal tube using Glidescope.  View was Grade 1 full glottis .  Number of attempts was 1.  Colorimetric CO2 detector was consistent with tracheal placement.  Complications/Tolerance None; patient tolerated the procedure well. Chest X-ray is ordered to verify placement.  EBL Minimal  Specimen(s) None   Webb Silversmith, DNP, CCRN, FNP-C, AGACNP-BC Acute Care & Family Nurse Practitioner  [facility]  See Amion for personal pager PCCM on call pager [phone] until 7 am

## 2023-03-27 NOTE — Progress Notes (Signed)
   03/27/23 0600  Spiritual Encounters  Type of Visit Initial  Referral source Code page  Reason for visit Code  OnCall Visit Yes   Chaplain responded to Rapid Response Code. No family present. Patient receiving care from medical team.

## 2023-03-27 NOTE — IPAL (Signed)
  Interdisciplinary Goals of Care Family Meeting   Date carried out: 03/27/2023  Location of the meeting: Bedside  Member's involved: Physician, Bedside Registered Nurse, and Family Member or next of kin    GOALS OF CARE DISCUSSION  The Clinical status was relayed to family in detail- Wife  Updated and notified of patients medical condition- Patient remains unresponsive and will not open eyes to command.   Patient with increased WOB and using accessory muscles to breathe Explained to family course of therapy and the modalities  Patient with Progressive multiorgan failure with a very high probablity of a very minimal chance of meaningful recovery despite all aggressive and optimal medical therapy.    PATIENT REMAINS FULL CODE  Family understands the situation.   Family are satisfied with Plan of action and management. All questions answered  Additional CC time 25 mins   John Peterson Santiago Glad, M.D.  [facility]  Medical Director [facility] Medical Director [facility] Cardio-Pulmonary Department

## 2023-03-27 NOTE — Progress Notes (Addendum)
MD notified my this RN patient appears inadequately sedated, abdominal breathing, increased RR. MD verbal order 4 versed, 100 fent.  Upon arrival to room, patient hypotensive and ordered meds held. MD providing patient care to other patient at this time, as soon as available, MD requests to room to assess further orders for patient. Sedation and BP medication titrated to meet vital goals. Daughter in law at bedside, updated by RN within scope.    Per MD updates to patient plan of care: Discontinue propofol gtt and initiate fentanyl gtt. Administer 1L LR bolus for fluid resuscitation.

## 2023-03-27 NOTE — Plan of Care (Signed)
  Problem: Clinical Measurements: Goal: Diagnostic test results will improve Outcome: Progressing Goal: Respiratory complications will improve Outcome: Progressing   Problem: Nutrition: Goal: Adequate nutrition will be maintained Outcome: Progressing   Problem: Coping: Goal: Level of anxiety will decrease Outcome: Progressing   Problem: Pain Managment: Goal: General experience of comfort will improve and/or be controlled Outcome: Progressing   Problem: Skin Integrity: Goal: Risk for impaired skin integrity will decrease Outcome: Progressing

## 2023-03-27 NOTE — ED Notes (Signed)
This RN concerned with pt's mental status and work of breathing. With assistance with 3 other staff members assisted pt into bed from recliner. This RN called Rapid response.

## 2023-03-27 NOTE — ED Provider Notes (Signed)
-----------------------------------------   6:06 AM on 03/27/2023 -----------------------------------------  I was asked by the patient's nurse to evaluate the patient at bedside because he is having a decreasing level of consciousness and responsiveness while on BiPAP.  The patient is still breathing on his own although he is tachypneic.  He is somnolent but awakens to sternal rub and to loud voice although he then falls back asleep.  Although I concur he is critically ill and may need to be intubated, he is not coding and needs a rapid response called overhead, not a CODE BLUE.  I instructed the ED secretary to call a rapid response overhead so that the hospital staff including the hospitalist team and respiratory therapy will respond to evaluate the patient and determine if a higher level of care or airway intervention is necessary.   Loleta Rose, MD 03/27/23 [phone]

## 2023-03-27 NOTE — Progress Notes (Signed)
Initial Nutrition Assessment  DOCUMENTATION CODES:   Obesity unspecified  INTERVENTION:   Vital HP @60ml /hr- Initiate at 6ml/hr and increase by 3ml/hr q 8 hours until goal rate is reached.   ProSource TF 20- Give 60ml daily via tube, each supplement provides 80kcal and 20g of protein.   Free water flushes 30ml q4 hours to maintain tube patency   Regimen provides 1520kcal/day, 146g/day protein and 1354ml/day of free water.   Pt at high refeed risk; recommend monitor potassium, magnesium and phosphorus labs daily until stable  Daily weights   NUTRITION DIAGNOSIS:   Inadequate oral intake related to inability to eat (pt sedated and ventilated) as evidenced by NPO status.  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  Pt sedated and ventilated. OGT in place. Will plan to initiate tube feeds today. Pt is likely at refeed risk. Per chart, pt's UBW appears to be 225-230lbs; pt has not been weighed this admission.    Medications reviewed and include: colace, lovenox, MVI, protonix, miralax, thiamine, azithromycin, ceftriaxone, levophed   Labs reviewed: K 4.2 wnl, BUN 68(H), creat 2.12(H) Wbc- 15.8(H)  NUTRITION - FOCUSED PHYSICAL EXAM:  Flowsheet Row Most Recent Value  Orbital Region Moderate depletion  Upper Arm Region No depletion  Thoracic and Lumbar Region No depletion  Buccal Region Mild depletion  Temple Region Moderate depletion  Clavicle Bone Region No depletion  Clavicle and Acromion Bone Region No depletion  Scapular Bone Region No depletion  Dorsal Hand No depletion  Patellar Region No depletion  Anterior Thigh Region No depletion  Posterior Calf Region No depletion  Edema (RD  Assessment) None  Hair Reviewed  Eyes Reviewed  Mouth Reviewed  Skin Reviewed  Nails Reviewed   Diet Order:   Diet Order             Diet NPO time specified  Diet effective now                  EDUCATION NEEDS:   No education needs have been identified at this time  Skin:  Skin Assessment: Reviewed RN Assessment  Last BM:  PTA  Height:   Ht Readings from Last 1 Encounters:  03/26/23 5\' 10"  (1.778 m)    Weight:   Wt Readings from Last 1 Encounters:  03/26/23 99.8 kg    Ideal Body Weight:  75.45 kg  BMI:  Body mass index is 31.57 kg/m.  Estimated Nutritional Needs:   Kcal:  1098-1397kcal/day  Protein:  >150g/day  Fluid:  1.9-2.2L/day  Betsey Holiday MS, RD, LDN If unable to be reached, please send secure chat to "RD inpatient" available from 8:00a-4:00p daily

## 2023-03-27 NOTE — Progress Notes (Signed)
       CROSS COVER NOTE  NAME: John Peterson MRN: [id number] DOB : 01-15-1946    Date of Service   03/27/2023   HPI/Events of Note   Nurse paged because patient's O2 levels were dropping to the mid 80s while on 6 L nasal cannula.  Patient is tachypneic and tachycardic.  Interventions   - Placed on HFNC -Ordered repeat checks x-ray.  No official read yet but right-sided effusion looks to be worsening from previous imaging. -Patient still tachypneic and visibly tripoding. -Blood gas obtained and it showed pH of 7.19 with CO2 of 60.  Patient placed on BiPAP for now. -PCCM consulted and patient's status changed to stepdown.      Lindsay Soulliere Lamin Geradine Girt, MSN, APRN, AGACNP-BC [facility] [place] Pager: [phone]. Check Amion for Availability

## 2023-03-28 ENCOUNTER — Inpatient Hospital Stay: Payer: Medicare PPO

## 2023-03-28 DIAGNOSIS — J90 Pleural effusion, not elsewhere classified: Secondary | ICD-10-CM | POA: Diagnosis not present

## 2023-03-28 DIAGNOSIS — J9601 Acute respiratory failure with hypoxia: Secondary | ICD-10-CM | POA: Diagnosis not present

## 2023-03-28 DIAGNOSIS — J9602 Acute respiratory failure with hypercapnia: Secondary | ICD-10-CM | POA: Diagnosis not present

## 2023-03-28 DIAGNOSIS — J09X2 Influenza due to identified novel influenza A virus with other respiratory manifestations: Secondary | ICD-10-CM | POA: Diagnosis not present

## 2023-03-28 LAB — PROTEIN / CREATININE RATIO, URINE
Creatinine, Urine: 75 mg/dL
Protein Creatinine Ratio: 0.63 mg/mg{creat} — ABNORMAL HIGH (ref 0.00–0.15)
Total Protein, Urine: 47 mg/dL

## 2023-03-28 LAB — URINALYSIS, COMPLETE (UACMP) WITH MICROSCOPIC
Bilirubin Urine: NEGATIVE
Glucose, UA: NEGATIVE mg/dL
Ketones, ur: NEGATIVE mg/dL
Leukocytes,Ua: NEGATIVE
Nitrite: NEGATIVE
Protein, ur: NEGATIVE mg/dL
Specific Gravity, Urine: 1.012 (ref 1.005–1.030)
pH: 5 (ref 5.0–8.0)

## 2023-03-28 LAB — RENAL FUNCTION PANEL
Albumin: 1.9 g/dL — ABNORMAL LOW (ref 3.5–5.0)
Anion gap: 12 (ref 5–15)
BUN: 92 mg/dL — ABNORMAL HIGH (ref 8–23)
CO2: 21 mmol/L — ABNORMAL LOW (ref 22–32)
Calcium: 8.2 mg/dL — ABNORMAL LOW (ref 8.9–10.3)
Chloride: 103 mmol/L (ref 98–111)
Creatinine, Ser: 3.38 mg/dL — ABNORMAL HIGH (ref 0.61–1.24)
GFR, Estimated: 18 mL/min — ABNORMAL LOW (ref >60)
Glucose, Bld: 77 mg/dL (ref 70–99)
Phosphorus: 6.3 mg/dL — ABNORMAL HIGH (ref 2.5–4.6)
Potassium: 4.4 mmol/L (ref 3.5–5.1)
Sodium: 136 mmol/L (ref 135–145)

## 2023-03-28 LAB — GLUCOSE, CAPILLARY
Glucose-Capillary: 107 mg/dL — ABNORMAL HIGH (ref 70–99)
Glucose-Capillary: 114 mg/dL — ABNORMAL HIGH (ref 70–99)
Glucose-Capillary: 117 mg/dL — ABNORMAL HIGH (ref 70–99)
Glucose-Capillary: 46 mg/dL — ABNORMAL LOW (ref 70–99)
Glucose-Capillary: 58 mg/dL — ABNORMAL LOW (ref 70–99)
Glucose-Capillary: 65 mg/dL — ABNORMAL LOW (ref 70–99)
Glucose-Capillary: 66 mg/dL — ABNORMAL LOW (ref 70–99)
Glucose-Capillary: 70 mg/dL (ref 70–99)
Glucose-Capillary: 75 mg/dL (ref 70–99)
Glucose-Capillary: 81 mg/dL (ref 70–99)

## 2023-03-28 LAB — RESPIRATORY PANEL BY PCR

## 2023-03-28 LAB — CBC
HCT: 36.8 % — ABNORMAL LOW (ref 39.0–52.0)
Hemoglobin: 12.5 g/dL — ABNORMAL LOW (ref 13.0–17.0)
MCH: 31.6 pg (ref 26.0–34.0)
MCHC: 34 g/dL (ref 30.0–36.0)
MCV: 93.2 fL (ref 80.0–100.0)
Platelets: 228 10*3/uL (ref 150–400)
RBC: 3.95 MIL/uL — ABNORMAL LOW (ref 4.22–5.81)
RDW: 15.1 % (ref 11.5–15.5)
WBC: 19.6 10*3/uL — ABNORMAL HIGH (ref 4.0–10.5)
nRBC: 0 % (ref 0.0–0.2)

## 2023-03-28 LAB — TRIGLYCERIDES: Triglycerides: 145 mg/dL (ref <150)

## 2023-03-28 LAB — MAGNESIUM: Magnesium: 2.7 mg/dL — ABNORMAL HIGH (ref 1.7–2.4)

## 2023-03-28 LAB — PROCALCITONIN: Procalcitonin: 44.12 ng/mL

## 2023-03-28 MED ORDER — ACETAMINOPHEN 325 MG PO TABS
650.0000 mg | ORAL_TABLET | ORAL | Status: DC | PRN
Start: 2023-03-28 — End: 2023-04-05
  Administered 2023-03-28 – 2023-04-01 (×3): 650 mg via ORAL
  Filled 2023-03-28 (×3): qty 2

## 2023-03-28 MED ORDER — ENSURE ENLIVE PO LIQD
237.0000 mL | Freq: Three times a day (TID) | ORAL | Status: DC
  Administered 2023-03-29 – 2023-04-05 (×16): 237 mL via ORAL

## 2023-03-28 MED ORDER — THIAMINE MONONITRATE 100 MG PO TABS
100.0000 mg | ORAL_TABLET | Freq: Every day | ORAL | Status: DC
  Administered 2023-03-29 – 2023-04-05 (×8): 100 mg via ORAL
  Filled 2023-03-28 (×8): qty 1

## 2023-03-28 MED ORDER — DEXTROSE 50 % IV SOLN
INTRAVENOUS | Status: AC
  Filled 2023-03-28: qty 50

## 2023-03-28 MED ORDER — DOCUSATE SODIUM 100 MG PO CAPS
100.0000 mg | ORAL_CAPSULE | Freq: Two times a day (BID) | ORAL | Status: DC
  Administered 2023-03-28 – 2023-03-31 (×6): 100 mg via ORAL
  Filled 2023-03-28 (×8): qty 1

## 2023-03-28 MED ORDER — OSELTAMIVIR PHOSPHATE 30 MG PO CAPS
30.0000 mg | ORAL_CAPSULE | Freq: Every day | ORAL | Status: DC
  Filled 2023-03-28: qty 1

## 2023-03-28 MED ORDER — FOLIC ACID 1 MG PO TABS
1.0000 mg | ORAL_TABLET | Freq: Every day | ORAL | Status: DC
  Administered 2023-03-29 – 2023-04-05 (×8): 1 mg via ORAL
  Filled 2023-03-28 (×8): qty 1

## 2023-03-28 MED ORDER — POLYETHYLENE GLYCOL 3350 17 G PO PACK
17.0000 g | PACK | Freq: Every day | ORAL | Status: DC
  Administered 2023-03-29 – 2023-03-31 (×2): 17 g via ORAL
  Filled 2023-03-28 (×3): qty 1

## 2023-03-28 MED ORDER — DEXTROSE 50 % IV SOLN: 12.5000 g | INTRAVENOUS | Status: AC

## 2023-03-28 MED ORDER — DEXTROSE 50 % IV SOLN
INTRAVENOUS | Status: AC
  Administered 2023-03-28: 12.5 g via INTRAVENOUS
  Filled 2023-03-28: qty 50

## 2023-03-28 MED ORDER — OSELTAMIVIR PHOSPHATE 30 MG PO CAPS
30.0000 mg | ORAL_CAPSULE | Freq: Every day | ORAL | Status: DC
  Administered 2023-03-29 – 2023-03-30 (×2): 30 mg via ORAL
  Filled 2023-03-28 (×2): qty 1

## 2023-03-28 MED ORDER — DEXTROSE 50 % IV SOLN
25.0000 g | INTRAVENOUS | Status: AC
  Administered 2023-03-28: 25 g via INTRAVENOUS

## 2023-03-28 MED ORDER — INSULIN ASPART 100 UNIT/ML IJ SOLN: 0.0000 [IU] | INTRAMUSCULAR | Status: DC

## 2023-03-28 MED ORDER — PANTOPRAZOLE SODIUM 40 MG PO TBEC
40.0000 mg | DELAYED_RELEASE_TABLET | Freq: Every day | ORAL | Status: DC
  Administered 2023-03-28 – 2023-04-04 (×8): 40 mg via ORAL
  Filled 2023-03-28 (×9): qty 1

## 2023-03-28 MED ORDER — BUDESONIDE 0.25 MG/2ML IN SUSP
0.2500 mg | Freq: Two times a day (BID) | RESPIRATORY_TRACT | Status: DC
  Administered 2023-03-28 – 2023-04-04 (×15): 0.25 mg via RESPIRATORY_TRACT
  Filled 2023-03-28 (×16): qty 2

## 2023-03-28 MED ORDER — REVEFENACIN 175 MCG/3ML IN SOLN
175.0000 ug | Freq: Every day | RESPIRATORY_TRACT | Status: DC
  Administered 2023-03-28 – 2023-04-04 (×7): 175 ug via RESPIRATORY_TRACT
  Filled 2023-03-28 (×8): qty 3

## 2023-03-28 MED ORDER — DEXTROSE 50 % IV SOLN
12.5000 g | INTRAVENOUS | Status: AC
  Administered 2023-03-28: 12.5 g via INTRAVENOUS
  Filled 2023-03-28: qty 50

## 2023-03-28 MED ORDER — DEXTROSE 50 % IV SOLN: 1.0000 | Freq: Once | INTRAVENOUS | Status: DC

## 2023-03-28 MED ORDER — ADULT MULTIVITAMIN W/MINERALS CH
1.0000 | ORAL_TABLET | Freq: Every day | ORAL | Status: DC
  Administered 2023-03-29 – 2023-04-05 (×8): 1 via ORAL
  Filled 2023-03-28 (×8): qty 1

## 2023-03-28 NOTE — Progress Notes (Signed)
Hypoglycemic Event  CBG: 46  Treatment: D50 50 mL (25 gm)  Symptoms: None  Follow-up CBG: Time:1157 CBG Result:114  Possible Reasons for Event: Inadequate meal intake  Comments/MD notified: Dr. Belia Heman was notified.     Suzzanne Cloud

## 2023-03-28 NOTE — Progress Notes (Signed)
Nutrition Follow Up Note   DOCUMENTATION CODES:   Obesity unspecified  INTERVENTION:   RD will add supplements with diet advancement   MVI po daily with diet advancement   Pt remains at high refeed risk; recommend monitor potassium, magnesium and phosphorus labs daily until stable  Daily weights   NUTRITION DIAGNOSIS:   Inadequate oral intake related to inability to eat (pt sedated and ventilated) as evidenced by NPO status. -ongoing   GOAL:   Patient will meet greater than or equal to 90% of their needs -not met   MONITOR:   Diet advancement, Labs, Weight trends, I & O's, Skin  ASSESSMENT:   78 y/o male with h/o HTN, HLD, PVD, GERD, CTCL, reported daily etoh use, R TKA and seizures who is admitted with PNA, sepsis, flu A and AKI.  Pt extubated this morning. BSE pending. RD will add supplements with diet advancement. Pt remains at refeed risk. No BM since admission. Pt with worsening AKI. Per chart, pt appears to be down ~16lbs(7%) from his last documented weight in chart from September.    Medications reviewed and include: colace, lovenox, folic acid, insulin, MVI, protonix, miralax, thiamine, azithromycin, ceftriaxone  Labs reviewed: K 4.4 wnl, BUN 92(H), creat 3.38(H), P 6.3(H), Mg 2.7(H) Wbc- 19.6(H) Cbgs- 46, 81, 117, 66, 70 x 24 hrs   Diet Order:   Diet Order             Diet NPO time specified  Diet effective now                  EDUCATION NEEDS:   No education needs have been identified at this time  Skin:  Skin Assessment: Reviewed RN Assessment  Last BM:  PTA  Height:   Ht Readings from Last 1 Encounters:  03/26/23 5\' 10"  (1.778 m)    Weight:   Wt Readings from Last 1 Encounters:  03/28/23 95.7 kg    Ideal Body Weight:  75.45 kg  BMI:  Body mass index is 30.27 kg/m.  Estimated Nutritional Needs:   Kcal:  2000-2300kcal/day  Protein:  100-115g/day  Fluid:  1.9-2.2L/day  Betsey Holiday MS, RD, LDN If unable to be reached,  please send secure chat to "RD inpatient" available from 8:00a-4:00p daily

## 2023-03-28 NOTE — Procedures (Signed)
Extubation Procedure Note  Patient Details:   Name: John Peterson DOB: 06-Feb-1946 MRN: [id number]   Airway Documentation:    Vent end date: 03/28/23 Vent end time: 0730   Evaluation  O2 sats: stable throughout Complications: No apparent complications Patient did tolerate procedure well. Bilateral Breath Sounds: Coarse crackles   Yes able to cough and speak.  Extubated to [facility] per discussion with MD.  Ronda Fairly [facility] 03/28/2023, 7:36 AM

## 2023-03-28 NOTE — Progress Notes (Signed)
PHARMACIST - PHYSICIAN COMMUNICATION  DR:   Belia Heman  CONCERNING: IV to Oral Route Change Policy  RECOMMENDATION: This patient is receiving Pantoprazole by the intravenous route. Based on criteria approved by the Pharmacy and Therapeutics Committee, the intravenous medication(s) is/are being converted to the equivalent oral dose form(s).  DESCRIPTION: These criteria include: The patient is eating (either orally or via tube) and/or has been taking other orally administered medications for a least 24 hours The patient has no evidence of active gastrointestinal bleeding or impaired GI absorption (gastrectomy, short bowel, patient on TNA or NPO).  If you have questions about this conversion, please contact the Pharmacy Department  []   [phone] )  [facility] []   [phone] )  [facility] []   [phone] )  [facility] []   [phone] )  [facility] []   [phone] )  Ilene Qua   Effie Shy, PharmD Pharmacy Resident  03/28/2023 2:55 PM

## 2023-03-28 NOTE — Consult Note (Signed)
[facility] Consult Note: 03/28/23     Date of Admission:  03/26/2023           Reason for Consult:  AKI   Referring Provider: Erin Fulling, MD Primary Care Provider: Gracelyn Nurse, MD   History of Presenting Illness:  John Peterson is a 78 y.o. male With medical problems of cutaneous T-cell lymphoma, hyperlipidemia, hypertension, seizure disorder, right total knee arthroplasty, peripheral vascular disease, GERD.  He presented to the emergency room for shortness of breath for the past few days.  His wife tested positive for flu after the cruise. He tested positive for influenza A and was diagnosed with acute respiratory failure with hypoxia, pleural effusion, and right lower lobe pneumonia caused by strep pneumo  Patient's baseline creatinine is normal at 0.9 from March 21, 2023.  Admission creatinine of 2.07 from March 26, 2023 which has increased to 3.38 today.  Nephrology consult has been requested for further evaluation.  Patient was intubated overnight and extubated this morning. When seen, patient was on high flow nasal cannula oxygen.  Able to answer simple questions although somewhat hard of hearing.  He has a Foley catheter with good urine output.   Review of Systems: Review of Systems  Constitutional:  Positive for chills and malaise/fatigue.  HENT:  Negative for hearing loss.   Eyes:  Negative for blurred vision.  Respiratory:  Positive for cough and shortness of breath.   Cardiovascular:  Negative for chest pain and leg swelling.  Gastrointestinal:  Negative for heartburn.  Genitourinary:  Negative for dysuria.  Musculoskeletal:  Negative for myalgias.  Skin:  Negative for rash.  Neurological:  Negative for dizziness.  Endo/Heme/Allergies:  Does not bruise/bleed easily.  Psychiatric/Behavioral:  Negative for depression.     Past Medical History:  Diagnosis Date   Allergy    Arthritis    Cancer (HCC)    CTCL   GERD  (gastroesophageal reflux disease)    Hx of dysplastic nevus 06/30/2009   L mid back, mild atypia   Hx of dysplastic nevus 09/23/2014   L posterior deltoid, moderate to severe atypia, excised   Hx of dysplastic nevus 09/14/2017   R lateral foot, mild atypia   Hyperlipidemia    Hypertension    Peripheral vascular disease (HCC)     Social History   Tobacco Use   Smoking status: Former    Current packs/day: 0.00    Types: Cigarettes    Quit date: 1968    Years since quitting: 57.1   Smokeless tobacco: Never  Vaping Use   Vaping status: Never Used  Substance Use Topics   Alcohol use: Yes    Alcohol/week: 7.0 standard drinks of alcohol    Types: 7 Cans of beer per week   Drug use: No    History reviewed. No pertinent family history.   OBJECTIVE: Blood pressure 124/64, pulse 97, temperature (!) 100.4 F (38 C), resp. rate (!) 29, height 5\' 10"  (1.778 m), weight 95.7 kg, SpO2 98%.  Physical Exam General Appearance-elderly gentleman, sitting up in the bed HEENT-anicteric, moist oral mucous membranes Pulmonary-high flow nasal cannula oxygen Coarse breath sounds bilaterally Cardiac-tachycardic Abdomen-soft, nontender Extremities-trace edema Neuro-alert, able to follow simple commands Skin-no acute rashes noted  Foley catheter in place   Lab Results Lab Results  Component Value Date   WBC 19.6 (H) 03/28/2023   HGB 12.5 (L) 03/28/2023   HCT 36.8 (L) 03/28/2023   MCV 93.2 03/28/2023   PLT 228  03/28/2023    Lab Results  Component Value Date   CREATININE 3.38 (H) 03/28/2023   BUN 92 (H) 03/28/2023   NA 136 03/28/2023   K 4.4 03/28/2023   CL 103 03/28/2023   CO2 21 (L) 03/28/2023    Lab Results  Component Value Date   ALT 32 03/27/2023   AST 63 (H) 03/27/2023   ALKPHOS 65 03/27/2023   BILITOT 1.0 03/27/2023     Microbiology: Recent Results (from the past 240 hours)  Resp panel by RT-PCR (RSV, Flu A&B, Covid) Anterior Nasal Swab     Status: Abnormal    Collection Time: 03/26/23  9:34 AM   Specimen: Anterior Nasal Swab  Result Value Ref Range Status   SARS Coronavirus 2 by RT PCR NEGATIVE NEGATIVE Final    Comment: (NOTE) SARS-CoV-2 target nucleic acids are NOT DETECTED.  The SARS-CoV-2 RNA is generally detectable in upper respiratory specimens during the acute phase of infection. The lowest concentration of SARS-CoV-2 viral copies this assay can detect is 138 copies/mL. A negative result does not preclude SARS-Cov-2 infection and should not be used as the sole basis for treatment or other patient management decisions. A negative result may occur with  improper specimen collection/handling, submission of specimen other than nasopharyngeal swab, presence of viral mutation(s) within the areas targeted by this assay, and inadequate number of viral copies(<138 copies/mL). A negative result must be combined with clinical observations, patient history, and epidemiological information. The expected result is Negative.  Fact Sheet for Patients:  BloggerCourse.com  Fact Sheet for Healthcare Providers:  SeriousBroker.it  This test is no t yet approved or cleared by the [place] FDA and  has been authorized for detection and/or diagnosis of SARS-CoV-2 by FDA under an Emergency Use Authorization (EUA). This EUA will remain  in effect (meaning this test can be used) for the duration of the COVID-19 declaration under Section 564(b)(1) of the Act, 21 U.S.C.section 360bbb-3(b)(1), unless the authorization is terminated  or revoked sooner.       Influenza A by PCR POSITIVE (A) NEGATIVE Final   Influenza B by PCR NEGATIVE NEGATIVE Final    Comment: (NOTE) The Xpert Xpress SARS-CoV-2/FLU/RSV plus assay is intended as an aid in the diagnosis of influenza from Nasopharyngeal swab specimens and should not be used as a sole basis for treatment. Nasal washings and aspirates are unacceptable for  Xpert Xpress SARS-CoV-2/FLU/RSV testing.  Fact Sheet for Patients: BloggerCourse.com  Fact Sheet for Healthcare Providers: SeriousBroker.it  This test is not yet approved or cleared by the [place] FDA and has been authorized for detection and/or diagnosis of SARS-CoV-2 by FDA under an Emergency Use Authorization (EUA). This EUA will remain in effect (meaning this test can be used) for the duration of the COVID-19 declaration under Section 564(b)(1) of the Act, 21 U.S.C. section 360bbb-3(b)(1), unless the authorization is terminated or revoked.     Resp Syncytial Virus by PCR NEGATIVE NEGATIVE Final    Comment: (NOTE) Fact Sheet for Patients: BloggerCourse.com  Fact Sheet for Healthcare Providers: SeriousBroker.it  This test is not yet approved or cleared by the [place] FDA and has been authorized for detection and/or diagnosis of SARS-CoV-2 by FDA under an Emergency Use Authorization (EUA). This EUA will remain in effect (meaning this test can be used) for the duration of the COVID-19 declaration under Section 564(b)(1) of the Act, 21 U.S.C. section 360bbb-3(b)(1), unless the authorization is terminated or revoked.  Performed at [facility], [address] [place], [place] [address]   Respiratory (~20 pathogens) panel by PCR     Status: Abnormal   Collection Time: 03/27/23  6:48 AM   Specimen: Nasopharyngeal Swab; Respiratory  Result Value Ref Range Status   Adenovirus NOT DETECTED NOT DETECTED Final   Coronavirus 229E NOT DETECTED NOT DETECTED Final    Comment: (NOTE) The Coronavirus on the Respiratory Panel, DOES NOT test for the novel  Coronavirus (2019 nCoV)    Coronavirus HKU1 NOT DETECTED NOT DETECTED Final   Coronavirus NL63 NOT DETECTED NOT DETECTED Final   Coronavirus OC43 NOT DETECTED NOT DETECTED Final   Metapneumovirus NOT DETECTED NOT  DETECTED Final   Rhinovirus / Enterovirus NOT DETECTED NOT DETECTED Final   Influenza A H1 2009 DETECTED (A) NOT DETECTED Final   Influenza B NOT DETECTED NOT DETECTED Final   Parainfluenza Virus 1 NOT DETECTED NOT DETECTED Final   Parainfluenza Virus 2 NOT DETECTED NOT DETECTED Final   Parainfluenza Virus 3 NOT DETECTED NOT DETECTED Final   Parainfluenza Virus 4 NOT DETECTED NOT DETECTED Final   Respiratory Syncytial Virus NOT DETECTED NOT DETECTED Final   Bordetella pertussis NOT DETECTED NOT DETECTED Final   Bordetella Parapertussis NOT DETECTED NOT DETECTED Final   Chlamydophila pneumoniae NOT DETECTED NOT DETECTED Final   Mycoplasma pneumoniae NOT DETECTED NOT DETECTED Final    Comment: Performed at [facility], [address], [place], [place] [address]  MRSA Next Gen by PCR, Nasal     Status: None   Collection Time: 03/27/23  8:26 AM   Specimen: Nasal Mucosa; Nasal Swab  Result Value Ref Range Status   MRSA by PCR Next Gen NOT DETECTED NOT DETECTED Final    Comment: (NOTE) The GeneXpert MRSA Assay (FDA approved for NASAL specimens only), is one component of a comprehensive MRSA colonization surveillance program. It is not intended to diagnose MRSA infection nor to guide or monitor treatment for MRSA infections. Test performance is not FDA approved in patients less than 37 years old. Performed at [facility], [address] [place], [place] [address]   Culture, Respiratory w Gram Stain     Status: None (Preliminary result)   Collection Time: 03/28/23  4:38 AM   Specimen: SPU  Result Value Ref Range Status   Specimen Description   Final    SPUTUM Performed at [facility], [address], [place], [place] [address]    Special Requests   Final    NONE Performed at [facility], [address] [place], [place] [address]    Gram Stain   Final    FEW WBC PRESENT, PREDOMINANTLY PMN RARE GRAM POSITIVE COCCI IN PAIRS Performed at [facility], [address] [place], [place] [address]    Culture PENDING  Incomplete   Report Status PENDING  Incomplete  Culture, blood (Routine X 2) w Reflex to ID Panel     Status: None (Preliminary result)   Collection Time: 03/28/23  6:18 AM   Specimen: BLOOD  Result Value Ref Range Status   Specimen Description BLOOD BLOOD RIGHT ARM  Final   Special Requests   Final    BOTTLES DRAWN AEROBIC AND ANAEROBIC Blood Culture adequate volume   Culture   Final    NO GROWTH < 12 HOURS Performed at [facility], [address] [place], [place] [address]    Report Status PENDING  Incomplete  Culture, blood (Routine X 2) w Reflex to ID  Panel     Status: None (Preliminary result)   Collection Time: 03/28/23  6:18 AM   Specimen: BLOOD  Result Value Ref Range Status   Specimen Description BLOOD BLOOD RIGHT HAND ANAEROBIC BOTTLE ONLY  Final   Special Requests   Final    BOTTLES DRAWN AEROBIC ONLY Blood Culture adequate volume   Culture   Final    NO GROWTH < 12 HOURS Performed at [facility], [address], [place], [place] [address]    Report Status PENDING  Incomplete    Medications: Scheduled Meds:  budesonide (PULMICORT) nebulizer solution  0.25 mg Nebulization BID   Chlorhexidine Gluconate Cloth  6 each Topical Daily   dextrose  1 ampule Intravenous Once   dextrose       docusate  100 mg Per Tube BID   enoxaparin (LOVENOX) injection  50 mg Subcutaneous Q24H   fentaNYL (SUBLIMAZE) injection  100 mcg Intravenous Once   folic acid  1 mg Per Tube Daily   insulin aspart  2-6 Units Subcutaneous Q4H   midazolam  4 mg Intravenous Once   oseltamivir  30 mg Per Tube Daily   pantoprazole (PROTONIX) IV  40 mg Intravenous QHS   polyethylene glycol  17 g Per Tube Daily   revefenacin  175 mcg Nebulization Daily   thiamine  100 mg Per Tube Daily   Continuous Infusions:  azithromycin Stopped (03/27/23 2339)   cefTRIAXone (ROCEPHIN)  IV Stopped (03/27/23 2224)    PRN Meds:.acetaminophen (TYLENOL) oral liquid 160 mg/5 mL, dextrose, fentaNYL, ipratropium-albuterol, midazolam, ondansetron **OR** ondansetron (ZOFRAN) IV, mouth rinse  Allergies  Allergen Reactions   Tramadol Other (See Comments)    PATIENT PREFERS TO AVOID ALL OPOIDS    Urinalysis: No results for input(s): "COLORURINE", "LABSPEC", "PHURINE", "GLUCOSEU", "HGBUR", "BILIRUBINUR", "KETONESUR", "PROTEINUR", "UROBILINOGEN", "NITRITE", "LEUKOCYTESUR" in the last 72 hours.  Invalid input(s): "APPERANCEUR"    Imaging: DG Chest 1 View Result Date: 03/28/2023 CLINICAL DATA:  78 year old male "pulmonary edema", intubated. EXAM: CHEST  1 VIEW COMPARISON:  Portable chest 03/27/2023 and earlier. FINDINGS: Portable AP upright view at 0554 hours. Stable visible endotracheal and enteric tubes. Moderate veiling opacity in the right lower lung is stable from the CT on 03/26/2023 which demonstrated combination of consolidated right lower lobe, small superimposed right pleural effusion. Small contralateral left pleural effusion now suspected and stable since yesterday. Stable cardiac size and mediastinal contours. No pneumothorax. Aerated upper lung pulmonary vascularity appears normal. Paucity of bowel gas. Stable visualized osseous structures. IMPRESSION: 1.  Stable lines and tubes. 2. No improvement in severe right lower lobe consolidation since the CT on 03/26/2023. Small volume superimposed pleural effusions. No evidence of pulmonary edema. Electronically Signed   By: Odessa Fleming M.D.   On: 03/28/2023 06:26   [place] CHEST (PLEURAL EFFUSION) Result Date: 03/27/2023 CLINICAL DATA:  Limited ultrasound of the chest done prior to possible thoracentesis. EXAM: CHEST ULTRASOUND COMPARISON:  Chest XR, 03/27/2023 FINDINGS: There is only a small pleural effusion present with no window to allow for safe approach for thoracentesis. IMPRESSION: Small pleural effusion. Thoracentesis was NOT performed Performed by Buzzy Han,  PA-C Electronically Signed   By: Roanna Banning M.D.   On: 03/27/2023 17:13   DG Abdomen 1 View Result Date: 03/27/2023 CLINICAL DATA:  Orogastric tube placement. EXAM: ABDOMEN - 1 VIEW COMPARISON:  None Available. FINDINGS: The bowel gas pattern is non-obstructive. Gaseous distention of the stomach noted. Otherwise there is paucity of bowel gas. No evidence of pneumoperitoneum,  within the limitations of a supine film. No acute osseous abnormalities. The soft tissues are within normal limits. Surgical changes, devices, tubes and lines: Orogastric tube noted with its tip and side hole overlying the left upper quadrant over the fundus/proximal stomach body region. IMPRESSION: *Orogastric tube noted with its tip and side hole overlying the left upper quadrant over the fundus/proximal stomach body region. Electronically Signed   By: Jules Schick M.D.   On: 03/27/2023 08:15   DG Chest Portable 1 View Result Date: 03/27/2023 CLINICAL DATA:  Intubation EXAM: PORTABLE CHEST 1 VIEW COMPARISON:  Earlier today FINDINGS: New endotracheal tube with tip just below the clavicular heads. The enteric tube at least reaches the gastric bubble. Right more than left lower chest opacification, extensive right lower lobe consolidation by recent CT. No pneumothorax. IMPRESSION: 1. New hardware in unremarkable position. 2. Stable aeration with right lower lobe consolidation. Electronically Signed   By: Tiburcio Pea M.D.   On: 03/27/2023 07:04   DG Chest Port 1 View Result Date: 03/27/2023 CLINICAL DATA:  Shortness of breath EXAM: PORTABLE CHEST 1 VIEW COMPARISON:  Yesterday FINDINGS: Airspace disease and pleural fluid opacifies the right base. Mild streaky opacity over the left diaphragm. No pneumothorax. Normal heart size and mediastinal contours. IMPRESSION: Stable pneumonia and pleural fluid at the right base. Electronically Signed   By: Tiburcio Pea M.D.   On: 03/27/2023 05:41   US RENAL Result Date:  03/26/2023 CLINICAL DATA:  Acute kidney injury EXAM: RENAL / URINARY TRACT ULTRASOUND COMPLETE COMPARISON:  None Available. FINDINGS: Right Kidney: Renal measurements: 12.6 x 6.3 x 6.2 cm = volume: 255 mL. Echogenicity within normal limits. No mass or hydronephrosis visualized. Left Kidney: Renal measurements: 12.5 x 5.9 by 4.8 cm = volume: 187 mL. 7.9 cm simple appearing cyst in the mid to lower pole. No follow-up imaging recommended. Normal echotexture. No hydronephrosis. Bladder: Appears normal for degree of bladder distention. Other: None. IMPRESSION: No acute findings.  No hydronephrosis. Electronically Signed   By: Charlett Nose M.D.   On: 03/26/2023 19:32   CT CHEST WO CONTRAST Result Date: 03/26/2023 CLINICAL DATA:  Respiratory illness, nondiagnostic xray EXAM: CT CHEST WITHOUT CONTRAST TECHNIQUE: Multidetector CT imaging of the chest was performed following the standard protocol without IV contrast. RADIATION DOSE REDUCTION: This exam was performed according to the departmental dose-optimization program which includes automated exposure control, adjustment of the mA and/or kV according to patient size and/or use of iterative reconstruction technique. COMPARISON:  Same day chest x-ray FINDINGS: Cardiovascular: Heart size within normal limits. No pericardial effusion. Thoracic aorta is nonaneurysmal. Scattered atherosclerotic vascular calcifications of the aorta and coronary arteries. Central pulmonary vasculature is nondilated. Mediastinum/Nodes: Several borderline enlarged mediastinal lymph nodes including 10 mm pretracheal node (series 2, image 42). No axillary lymphadenopathy. Evaluation of the hilar structures is limited in the absence of intravenous contrast. Within this limitation, no obvious hilar adenopathy or mass is identified. Lungs/Pleura: Complete consolidation of the right lower lobe with air bronchograms. Small-moderate right pleural effusion. Mild subsegmental atelectasis at the left lung  base. No left-sided pleural effusion. No pneumothorax. Upper Abdomen: No acute abnormality. Musculoskeletal: No chest wall mass or suspicious bone lesions identified. IMPRESSION: 1. Complete consolidation of the right lower lobe with air bronchograms, compatible with pneumonia. 2. Small-moderate right pleural effusion. 3. Several borderline enlarged mediastinal lymph nodes, likely reactive. 4. Aortic and coronary artery atherosclerosis (ICD10-I70.0). Electronically Signed   By: Duanne Guess D.O.   On: 03/26/2023 18:37  Assessment/Plan:  ASHBY MOSKAL is a 78 y.o. male with medical problems of  cutaneous T-cell lymphoma, hyperlipidemia, hypertension, seizure disorder, right total knee arthroplasty, peripheral vascular disease, GERD was admitted on 03/26/2023 for :  Shortness of breath [R06.02] Pleural effusion [J90] Acute respiratory failure with hypoxia (HCC) [J96.01] AKI (acute kidney injury) (HCC) [N17.9] Influenza [J11.1]  1.  Acute kidney injury Patient's baseline creatinine is normal at 0.9 from 03/21/2023. Today's creatinine has increased to 3.38, BUN 92. Urinalysis not available Imaging-no recent renal imaging is available. Foley catheter in place with dark yellow urine AKI likely secondary to ATN from concurrent illness/pneumonia/influenza A. Patient has mixed acidosis, increased BUN, elevated lactic acid. Potassium is in the normal range Case discussed with ICU team. Electrolytes and volume status are acceptable at this time.  No acute indication for dialysis.  We will monitor renal function daily.  2.  Acute respiratory failure, hypoxic Influenza A positive Right lower lobe pneumonia-streptococcal pneumonia Currently getting treatment with Tamiflu, ceftriaxone and azithromycin. Management as per ICU team.     John Peterson 03/28/23

## 2023-03-28 NOTE — Progress Notes (Signed)
Patient awake and alert this morning.  Placed on pressure support 10/5 and tolerating well.  Creatinine worsened this morning 3.38 up from 2.12 with a decreased urine output less than 20 cc an hour in the last 24 hours.  He is currently 6 L positive.  Nephrology has been consulted.  Family members wife and daughter-in-law updated this morning via telephone.  Discussed possible CRRT versus HD if indicated pending nephrology recommendations.   Webb Silversmith, DNP, CCRN, FNP-C, AGACNP-BC Acute Care & Family Nurse Practitioner  [facility]  See Amion for personal pager PCCM on call pager [phone] until 7 am

## 2023-03-28 NOTE — Consult Note (Addendum)
NAME:  RANDAL GOENS, MRN:  [id number], DOB:  September 15, 1945, LOS: 2 ADMISSION DATE:  03/26/2023, CONSULTATION DATE: 03/27/2023 REFERRING MD: Arville Care, NP REASON FOR CONSULT: Acute respiratory distress  HPI  78 y.o male with significant PMH of CTCL (cutaneous T-cell cell lymphoma), hyperlipidemia, hypertension, seizure, right TKA, PVD, and GERD who presented to the ED with chief complaints of progressive shortness of breath.  Per ED reports and admit notes, patient recently came back from a cruise with his wife who tested positive for flu. Patient developed progressive shortness of breath, generalized malaise, cough and increased work of breathing this week. Symptoms worsened over the past 4 to 5 days prior to presenting to the ED   ED Course: Initial vital signs showed HR of 112 beats/minute, BP mm Hg, the RR 44 breaths/minute, and the oxygen saturation 94% on and a temperature of 98.69F (36.0C).  Pertinent Labs/Diagnostics Findings: Na+/ K+: 132/3.8.  Glucose: 122.  BUN/Cr.:  55/2.07.  CO2 17, anion gap 16  WBC: 8.8~15.8 PCT: 16.57 COVID PCR: Negative, influenza A positive VBG: pO2 46; pCO2 62; pH 7.19;  HCO3 23.7, %O2 Sat 69.9.  CXR> CTA Chest> SEE RESULT Medication administered in the ED: Patient given gentle fluids due to pulmonary edema, IV Lasix and started on broad-spectrum antibiotics Ceftriaxone and Azithromycin for sepsis secondary to pneumonia. Disposition: Admitted to [facility] service. Patient became hypoxic in the mid 80's on 6L [place], tachypneic, tachycardic with increased work of breathing. He was placed on BiPAP with no improvement. PCCM consulted and patient intubated.  Past Medical History  CTCL (cutaneous T-cell cell lymphoma), hyperlipidemia, hypertension, seizure, right TKA, PVD, and GERD   Significant Hospital Events   2/16: Admitted to our services acute hypoxic hypercapnic respiratory failure secondary to influenza A bronchopneumonia and right pleural effusion. 2/17: Failed  BiPAP requiring intubation.  PCCM consulted 2/18: SAT/SBT this morning, placed on PS 10/5. patient awake and following commands. No pressor requirements. Renal function worsening with decreased urine output <30cc/he in last 24 hrs. I&O +6L. Nephrology consulted  Consults:  PCCM IR  Procedures:  2/17: Intubation  Significant Diagnostic Tests:  02/16: Chest Xray> IMPRESSION: Large right base alveolar opacity consistent with layering pleural effusion.  02/16: CTA Chest> IMPRESSION: 1. Complete consolidation of the right lower lobe with air bronchograms, compatible with pneumonia. 2. Small-moderate right pleural effusion. 3. Several borderline enlarged mediastinal lymph nodes, likely reactive. 4. Aortic and coronary artery atherosclerosis (ICD10-I70.0).  Interim History / Subjective:    -intubated and sedated  Micro Data:  2/16: SARS-CoV-2 PCR> negative 2/16: Influenza PCR> negative 2/16: Blood culture x2> 2/17: MRSA PCR>> NOT DETECTED 2/17: Strep pneumo urinary antigen>Positive 2/17: Legionella urinary antigen>  Antimicrobials:  Azithromycin 2/16>> Ceftriaxone 2/16>>   OBJECTIVE  Blood pressure 121/67, pulse 93, temperature 99.9 F (37.7 C), resp. rate (!) 25, height 5\' 10"  (1.778 m), weight 95.7 kg, SpO2 97%.  Vent Mode: PRVC FiO2 (%):  [40 %-60 %] 50 % Set Rate:  [20 bmp] 20 bmp Vt Set:  [500 mL] 500 mL PEEP:  [8 cmH20-10 cmH20] 10 cmH20 Plateau Pressure:  [18 cmH20-25 cmH20] 21 cmH20   Intake/Output Summary (Last 24 hours) at 03/28/2023 0526 Last data filed at 03/28/2023 0517 Gross per 24 hour  Intake 5896.2 ml  Output 285 ml  Net 5611.2 ml   Filed Weights   03/26/23 0931 03/28/23 0500  Weight: 99.8 kg 95.7 kg    Physical Examination  GENERAL: 78 year-old critically ill patient lying in the bed  intubated and sedated EYES: PEERLA. No scleral icterus. Extraocular muscles intact.  HEENT: Head atraumatic, normocephalic. Oropharynx and nasopharynx clear.   NECK:  No JVD, supple  LUNGS: Decreased breath sounds bilaterally.  No use of accessory muscles of respiration.  CARDIOVASCULAR: S1, S2 normal. No murmurs, rubs, or gallops.  ABDOMEN: Soft, NTND EXTREMITIES: +pitting edema of bilateral lower extremities.  Capillary refill < 3 seconds in all extremities. Pulses palpable distally. NEUROLOGIC: The patient is intubated and sedated . No focal neurological deficit appreciated. Cranial nerves are intact.  SKIN: No obvious rash, lesion, or ulcer. Warm to touch Labs/imaging that I havepersonally reviewed  (right click and "Reselect all SmartList Selections" daily)     Labs   CBC: Recent Labs  Lab 03/26/23 0934 03/27/23 0512 03/28/23 0459  WBC 8.8 15.8* 19.6*  HGB 14.9 14.6 12.5*  HCT 42.5 44.3 36.8*  MCV 92.8 96.9 93.2  PLT 251 251 228    Basic Metabolic Panel: Recent Labs  Lab 03/26/23 0934 03/27/23 0512  NA 132* 135  K 3.8 4.2  CL 99 101  CO2 17* 19*  GLUCOSE 122* 90  BUN 55* 68*  CREATININE 2.07* 2.12*  CALCIUM 9.0 8.5*   GFR: Estimated Creatinine Clearance: 33.3 mL/min (A) (by C-G formula based on SCr of 2.12 mg/dL (H)). Recent Labs  Lab 03/26/23 0934 03/27/23 0512 03/27/23 0640 03/27/23 0940 03/28/23 0459  PROCALCITON 16.57  --   --  47.46  --   WBC 8.8 15.8*  --   --  19.6*  LATICACIDVEN  --   --  2.1* 2.0*  --     Liver Function Tests: Recent Labs  Lab 03/27/23 0512  AST 63*  ALT 32  ALKPHOS 65  BILITOT 1.0  PROT 5.9*  ALBUMIN 2.4*   No results for input(s): "LIPASE", "AMYLASE" in the last 168 hours. No results for input(s): "AMMONIA" in the last 168 hours.  ABG    Component Value Date/Time   HCO3 23.7 03/27/2023 0512   ACIDBASEDEF 5.5 (H) 03/27/2023 0512   O2SAT 69.9 03/27/2023 0512      CBG: Recent Labs  Lab 03/27/23 2035 03/27/23 2334 03/28/23 0147 03/28/23 0327 03/28/23 0400  GLUCAP 94 70 70 66* 117*    Review of Systems:   Unable to be obtained secondary to the patient's  intubated and sedated status.   Past Medical History  He,  has a past medical history of Allergy, Arthritis, Cancer (HCC), GERD (gastroesophageal reflux disease), dysplastic nevus (06/30/2009), dysplastic nevus (09/23/2014), dysplastic nevus (09/14/2017), Hyperlipidemia, Hypertension, and Peripheral vascular disease (HCC).   Surgical History    Past Surgical History:  Procedure Laterality Date   HYDROCELE EXCISION  11/1998   JOINT REPLACEMENT Left 03/06/2013   L Knee Medial Compartment Makoplasty   JOINT REPLACEMENT Left 03/02/2016   L Knee Unicondylar converted to Total Replacement Mako   MOLE REMOVAL  07/2017   TOTAL KNEE ARTHROPLASTY Right 04/02/2019   Procedure: TOTAL KNEE ARTHROPLASTY;  Surgeon: Christena Flake, MD;  Location: [facility] ORS;  Service: Orthopedics;  Laterality: Right;     Social History   reports that he quit smoking about 57 years ago. His smoking use included cigarettes. He has never used smokeless tobacco. He reports current alcohol use of about 7.0 standard drinks of alcohol per week. He reports that he does not use drugs.   Family History   His family history is not on file.   Allergies Allergies  Allergen Reactions   Tramadol Other (See  Comments)    PATIENT PREFERS TO AVOID ALL OPOIDS   Home Medications  Prior to Admission medications   Medication Sig Start Date End Date Taking? Authorizing Provider  allopurinol (ZYLOPRIM) 100 MG tablet Take 100 mg by mouth daily. 05/25/16  Yes [provider]  amLODipine (NORVASC) 10 MG tablet Take 10 mg by mouth daily.  03/21/16  Yes [provider]  atorvastatin (LIPITOR) 10 MG tablet Take 10 mg by mouth daily.  03/28/16  Yes [provider]  celecoxib (CELEBREX) 200 MG capsule Take by mouth. 04/04/19  Yes [provider]  lisinopril (PRINIVIL,ZESTRIL) 40 MG tablet Take 40 mg by mouth daily. 03/11/16  Yes [provider]  tamsulosin (FLOMAX) 0.4 MG CAPS capsule Take 0.4 mg by mouth  daily after supper.  05/03/17  Yes [provider]  acetaminophen (TYLENOL) 500 MG tablet Take 1,000 mg by mouth daily.    [provider]  amoxicillin (AMOXIL) 500 MG capsule Take 1,000 mg by mouth See admin instructions. TAKE 2 CAPSULES (1000 MG) BY MOUTH 1 HOUR PRIOR TO DENTAL APPOINTMENTS Patient not taking: Reported on 03/27/2023    [provider]  azelastine (OPTIVAR) 0.05 % ophthalmic solution Place 1 drop into both eyes 2 (two) times daily as needed (dry eyes.). Patient not taking: Reported on 06/11/2021    [provider]  Ca Carbonate-Mag Hydroxide (ROLAIDS PO) Take 1 tablet by mouth at bedtime.    [provider]  cyclobenzaprine (FLEXERIL) 10 MG tablet Take 10 mg by mouth 3 (three) times daily as needed for muscle spasms. Patient not taking: Reported on 03/27/2023    [provider]  desonide (DESOWEN) 0.05 % cream Apply to aa's QD-BID PRN. 10/20/22   Deirdre Evener, MD  famotidine (PEPCID) 20 MG tablet Take 20 mg by mouth at bedtime.  07/06/10   [provider]  finasteride (PROSCAR) 5 MG tablet Take by mouth. Patient not taking: Reported on 10/20/2022 04/05/21   [provider]  fluticasone (FLONASE) 50 MCG/ACT nasal spray Place 1 spray into both nostrils daily.    [provider]  ketoconazole (NIZORAL) 2 % cream Apply topically daily. APPLIED TO FEET 10/20/22   Deirdre Evener, MD  loratadine (CLARITIN) 10 MG tablet Take 10 mg by mouth daily.  07/06/10   [provider]  Multiple Vitamins-Minerals (MULTIVITAMIN WITH MINERALS) tablet Take 1 tablet by mouth daily.     [provider]  Scheduled Meds:  Chlorhexidine Gluconate Cloth  6 each Topical Daily   docusate  100 mg Per Tube BID   enoxaparin (LOVENOX) injection  50 mg Subcutaneous Q24H   feeding supplement (PROSource TF20)  60 mL Per Tube Daily   fentaNYL (SUBLIMAZE) injection  100 mcg Intravenous Once   folic acid  1 mg Per Tube  Daily   free water  30 mL Per Tube Q4H   insulin aspart  2-6 Units Subcutaneous Q4H   midazolam  4 mg Intravenous Once   multivitamin  15 mL Per Tube Daily   mouth rinse  15 mL Mouth Rinse Q2H   oseltamivir  30 mg Per Tube BID   pantoprazole (PROTONIX) IV  40 mg Intravenous QHS   polyethylene glycol  17 g Per Tube Daily   thiamine  100 mg Per Tube Daily   Continuous Infusions:  sodium chloride Stopped (03/27/23 0621)   azithromycin Stopped (03/27/23 2339)   cefTRIAXone (ROCEPHIN)  IV Stopped (03/27/23 2224)   feeding supplement (VITAL HIGH PROTEIN)  30 mL/hr at 03/28/23 0105   fentaNYL infusion INTRAVENOUS 125 mcg/hr (03/28/23 0009)   norepinephrine (LEVOPHED) Adult infusion Stopped (03/27/23 2101)   propofol (DIPRIVAN) infusion Stopped (03/27/23 0922)   PRN Meds:.acetaminophen (TYLENOL) oral liquid 160 mg/5 mL, fentaNYL, ipratropium-albuterol, midazolam, ondansetron **OR** ondansetron (ZOFRAN) IV, mouth rinse  Active Hospital Problem list   See systems below  Assessment & Plan:  #Acute Hypoxic and Hypercapnic Respiratory Failure Secondary to: #Influenza type A and superimposed Strep Bronchopneumonia  #Right Pleural Effusion  -full mechanical support 6-8cc/kg/Vt  -titrate FiO2, PEEP to maintain O2 sat >90%  -Lung protective ventilation  -PRN Chest X-ray & ABG -PRN and scheduled bronchodilators -SAT/SBT when appropriate  -IR consult for thoracentesis, not indicated   #Sepsis Secondary to Influenza A and Right Bronchopneumonia(Strep Pneumo +) -F/u cultures, trend lactic/ PCT -Monitor WBC/ fever curve -IV antibiotics: Ceftriaxone and azithromycin -Continue Tamiflu -IVF hydration as needed -Obtain tracheal aspirate, Legionella antigen -Pressors for MAP goal >65 -Strict I/O's    #AKI with cr. 2.12 mg/dL from a baseline of 1.61 mg/dL likely secondary to septic ATN~WORSENING #AGMA -Trend Lactate -Monitor I&O's / urinary output -Follow BMP -Ensure adequate renal  perfusion -Avoid nephrotoxic agents as able -Replace electrolytes as indicated -Nephrology consulted    #Sedation needs in setting of mechanical ventilation #Acute Metabolic Encephalopathy -Avoid sedating medications as able -Daily wake up assessment -prn fentanyl, prop for RASS -1   #HTN #HLD -Amlodipine in the setting of sedation related hypotension -Continue atorvastatin  #Hypoglycemia -CBG q2 -Follow ICU Hypoglycemic protocol -start D5  Best practice:  Diet:  Tube Feed  Pain/Anxiety/Delirium protocol (if indicated): Yes (RASS goal -1) VAP protocol (if indicated): Yes DVT prophylaxis: LMWH GI prophylaxis: H2B Glucose control:  SSI No Central venous access:  N/A Arterial line:  N/A Foley:  Yes, and it is still needed Mobility:  bed rest  PT consulted: N/A Last date of multidisciplinary goals of care discussion [updated significant other of change in status] Code Status:  full code Disposition: ICU   = Goals of Care = Code Status Order: FULL  Primary Emergency Contact: Daisey Must, Home Phone: [phone] Wishes to pursue full aggressive treatment and intervention options, including CPR and intubation,   Critical care time: 45 minutes        Webb Silversmith DNP, CCRN, FNP-C, AGACNP-BC Acute Care & Family Nurse Practitioner [place] [facility] PCCM on call pager [phone]    ICU ATTENDING ATTESTATION:  Patient seen and examined and relevant ancillary tests reviewed.   I agree with the assessment and plan of care as outlined by Webb Silversmith NP.    BP 123/65 (BP Location: Right Arm)   Pulse 90   Temp 99.9 F (37.7 C) (Bladder)   Resp (!) 27   Ht 5\' 10"  (1.778 m)   Wt 95.7 kg   SpO2 100%   BMI 30.27 kg/m   CBC    Component Value Date/Time   WBC 19.6 (H) 03/28/2023 0459   RBC 3.95 (L) 03/28/2023 0459   HGB 12.5 (L) 03/28/2023 0459   HCT 36.8 (L) 03/28/2023 0459   PLT 228 03/28/2023 0459   MCV 93.2  03/28/2023 0459   MCH 31.6 03/28/2023 0459   MCHC 34.0 03/28/2023 0459   RDW 15.1 03/28/2023 0459   LYMPHSABS 1.8 01/14/2019 1100   MONOABS 0.6 01/14/2019 1100   EOSABS 0.2 01/14/2019 1100   BASOSABS 0.1 01/14/2019 1100        Latest Ref Rng & Units 03/28/2023  4:59 AM 03/27/2023    5:12 AM 03/26/2023    9:34 AM  BMP  Glucose 70 - 99 mg/dL 77  90  528   BUN 8 - 23 mg/dL 92  68  55   Creatinine 0.61 - 1.24 mg/dL 4.13  2.44  0.10   Sodium 135 - 145 mmol/L 136  135  132   Potassium 3.5 - 5.1 mmol/L 4.4  4.2  3.8   Chloride 98 - 111 mmol/L 103  101  99   CO2 22 - 32 mmol/L 21  19  17    Calcium 8.9 - 10.3 mg/dL 8.2  8.5  9.0      INFLUENZA PNEUMONIA WITH STREP PNEUMONIA WITH SEPTIC SHOCK AND RENAL FAILURE    Critical Care Time devoted to patient care services described in this note is 50 Total Care Time minutes.   Overall, patient is critically ill, prognosis is guarded.  Patient with Multiorgan failure and at high risk for cardiac arrest and death.    Lucie Leather, M.D.  [facility]  Medical Director [facility] Medical Director [facility] Cardio-Pulmonary Department

## 2023-03-28 NOTE — Progress Notes (Signed)
PHARMACY NOTE:  ANTIMICROBIAL RENAL DOSAGE ADJUSTMENT  Current antimicrobial regimen includes a mismatch between antimicrobial dosage and estimated renal function.  As per policy approved by the Pharmacy & Therapeutics and Medical Executive Committees, the antimicrobial dosage will be adjusted accordingly.  Current antimicrobial dosage:  Tamiflu 30 mg BID  Indication: Influenza A infection  Renal Function:  Estimated Creatinine Clearance: 20.9 mL/min (A) (by C-G formula based on SCr of 3.38 mg/dL (H)). []      On intermittent HD, scheduled: []      On CRRT    Antimicrobial dosage has been changed to:  Tamiflu 30 mg daily  Additional comments: Continue to monitor renal function as patient developed an AKI  Thank you for allowing pharmacy to be a part of this patient's care.  Effie Shy, PharmD Pharmacy Resident  03/28/2023 7:53 AM

## 2023-03-28 NOTE — Plan of Care (Signed)
  Problem: Clinical Measurements: Goal: Diagnostic test results will improve Outcome: Progressing Goal: Respiratory complications will improve Outcome: Progressing   Problem: Nutrition: Goal: Adequate nutrition will be maintained Outcome: Progressing   Problem: Pain Managment: Goal: General experience of comfort will improve and/or be controlled Outcome: Progressing   Problem: Skin Integrity: Goal: Risk for impaired skin integrity will decrease Outcome: Progressing

## 2023-03-28 NOTE — Progress Notes (Signed)
Hypoglycemic Event  CBG: 58  Treatment: D50 25 mL (12.5 gm)  Symptoms: None  Follow-up CBG: Time:1609 CBG Result:107  Possible Reasons for Event: Inadequate meal intake  Comments/MD notified:Dr. Fonda Kinder

## 2023-03-28 NOTE — Progress Notes (Signed)
PHARMACY CONSULT NOTE - FOLLOW UP  Pharmacy Consult for Electrolyte Monitoring and Replacement   Recent Labs: Potassium (mmol/L)  Date Value  03/28/2023 4.4   Magnesium (mg/dL)  Date Value  16/11/9602 2.7 (H)   Calcium (mg/dL)  Date Value  54/10/8117 8.2 (L)   Albumin (g/dL)  Date Value  14/78/2956 1.9 (L)   Phosphorus (mg/dL)  Date Value  21/30/8657 6.3 (H)   Sodium (mmol/L)  Date Value  03/28/2023 136   Assessment: SV is a 78 yo male who presented to the ED with complaints of progressive shortness of breath. Past medical history significant for CTCL (cutaneous T-cell cell lymphoma), hyperlipidemia, hypertension, seizure, right TKA, PVD, and GERD. They just came back from a cruise where their wife tested positive for flu - they also have tested positive for flu A. They were admitted to the ICU due to hypoxia and then intubated with no improvement. Pharmacy has been consulted to manage this patient's electrolytes while they're admitted to the ICU.   Fluids: Free water 30 mL Q4H   Goal of Therapy:  Electrolytes WNL Today, 03/28/2023 K = 4.4 Na = 136 (hx of chronically running low) Mg = 2.7 Phos = 6.3  Plan:  No replacement indicated at this time Continue to monitor with AM labs daily   Effie Shy, PharmD Pharmacy Resident  03/28/2023 7:27 AM

## 2023-03-28 NOTE — Plan of Care (Signed)
  Problem: Clinical Measurements: Goal: Ability to maintain clinical measurements within normal limits will improve Outcome: Progressing Goal: Will remain free from infection Outcome: Progressing Goal: Respiratory complications will improve Outcome: Progressing Goal: Cardiovascular complication will be avoided Outcome: Progressing   Problem: Nutrition: Goal: Adequate nutrition will be maintained Outcome: Progressing   Problem: Coping: Goal: Level of anxiety will decrease Outcome: Progressing   Problem: Elimination: Goal: Will not experience complications related to urinary retention Outcome: Progressing   Problem: Pain Managment: Goal: General experience of comfort will improve and/or be controlled Outcome: Progressing   Problem: Safety: Goal: Ability to remain free from injury will improve Outcome: Progressing   Problem: Skin Integrity: Goal: Risk for impaired skin integrity will decrease Outcome: Progressing

## 2023-03-29 DIAGNOSIS — J9601 Acute respiratory failure with hypoxia: Secondary | ICD-10-CM | POA: Diagnosis not present

## 2023-03-29 LAB — RENAL FUNCTION PANEL
Albumin: 1.9 g/dL — ABNORMAL LOW (ref 3.5–5.0)
Anion gap: 10 (ref 5–15)
BUN: 94 mg/dL — ABNORMAL HIGH (ref 8–23)
CO2: 20 mmol/L — ABNORMAL LOW (ref 22–32)
Calcium: 8.1 mg/dL — ABNORMAL LOW (ref 8.9–10.3)
Chloride: 104 mmol/L (ref 98–111)
Creatinine, Ser: 2.87 mg/dL — ABNORMAL HIGH (ref 0.61–1.24)
GFR, Estimated: 22 mL/min — ABNORMAL LOW (ref >60)
Glucose, Bld: 82 mg/dL (ref 70–99)
Phosphorus: 5.6 mg/dL — ABNORMAL HIGH (ref 2.5–4.6)
Potassium: 4.4 mmol/L (ref 3.5–5.1)
Sodium: 134 mmol/L — ABNORMAL LOW (ref 135–145)

## 2023-03-29 LAB — CBC
HCT: 38.7 % — ABNORMAL LOW (ref 39.0–52.0)
Hemoglobin: 13.5 g/dL (ref 13.0–17.0)
MCH: 31.5 pg (ref 26.0–34.0)
MCHC: 34.9 g/dL (ref 30.0–36.0)
MCV: 90.4 fL (ref 80.0–100.0)
Platelets: 237 10*3/uL (ref 150–400)
RBC: 4.28 MIL/uL (ref 4.22–5.81)
RDW: 14.6 % (ref 11.5–15.5)
WBC: 20.2 10*3/uL — ABNORMAL HIGH (ref 4.0–10.5)
nRBC: 0 % (ref 0.0–0.2)

## 2023-03-29 LAB — GLUCOSE, CAPILLARY
Glucose-Capillary: 79 mg/dL (ref 70–99)
Glucose-Capillary: 95 mg/dL (ref 70–99)

## 2023-03-29 LAB — MAGNESIUM: Magnesium: 2.7 mg/dL — ABNORMAL HIGH (ref 1.7–2.4)

## 2023-03-29 MED ORDER — SODIUM CHLORIDE 0.9 % IV SOLN
500.0000 mg | INTRAVENOUS | Status: AC
  Administered 2023-03-29 – 2023-03-30 (×2): 500 mg via INTRAVENOUS
  Filled 2023-03-29 (×3): qty 5

## 2023-03-29 MED ORDER — LACTULOSE 10 GM/15ML PO SOLN
30.0000 g | Freq: Once | ORAL | Status: AC
  Administered 2023-03-29: 30 g via ORAL
  Filled 2023-03-29: qty 60

## 2023-03-29 NOTE — Progress Notes (Signed)
Progress Note   Patient: John Peterson ZOX:[id number] DOB: [birth year] DOA: 03/26/2023     3 DOS: the patient was seen and examined on 03/29/2023   Brief hospital course: HPI on admission to ICU 03/26/23:  "78 y.o male with significant PMH of CTCL (cutaneous T-cell cell lymphoma), hyperlipidemia, hypertension, seizure, right TKA, PVD, and GERD who presented to the ED with chief complaints of progressive shortness of breath.   Per ED reports and admit notes, patient recently came back from a cruise with his wife who tested positive for flu. Patient developed progressive shortness of breath, generalized malaise, cough and increased work of breathing this week. Symptoms worsened over the past 4 to 5 days prior to presenting to the ED"  Pt was admitted to the ICU for further evaluation and management of sepsis secondary to pneumonia and influenza A, initially requiring intubation and mechanical ventilation from 2/17 >> 03/28/23 when pt was successfully extubated to heated high flow oxygen.    [facility] assumed care on 03/29/23.  Further hospital course and management as outlined below.  2/19 -- weaned 45 L/min HHF O2 >> 4 L/min [place] O2 today.  Stable for transfer to med/tele floor.   Assessment and Plan:  Acute respiratory failure with hypoxia and hypercapnia Influenza A Secondary to strep pneumoniae pneumonia Right pleural effusion Sepsis due to flu and pneumonia Patient almost like required intubation mechanical ventilation ICU, successfully extubated to heated high flow oxygen on 2/18. Weaned from 45 L/min heated high flow to 4 L/min nasal cannula O2 this morning --Continue IV antibiotics -cefepime, Zithromax -- Scheduled and as needed neb treatments -- Repeat chest x-ray and blood gases as needed -- Continue Tamiflu -- Sepsis physiology improved, maintain MAP above 65 -- Monitor fever curve, CBC -- Follow cultures  AKI due to ATN Anion gap metabolic acidosis --Nephrology is following --  I/O's, urinary output closely -- MAP above 65 for adequate renal perfusion -- Monitor electrolytes, renal parameters  Hypoglycemia due to poor p.o. intake related to acute illness and encephalopathy -- Monitor CBGs closely -- Hypoglycemia protocol -- Currently off D5W  Acute metabolic encephalopathy Due to acute illness.  Mental status improving. --Management of underlying conditions as outlined --Delirium precautions  Hypertension --Continue holding amlodipine with soft BP  Hyperlipidemia -- Continue Lipitor          Subjective: Patient seen this morning in stepdown.  He was sleeping but arousable to voice.  He reports being tired but denies other complaints.  Physical Exam: Vitals:   03/29/23 1000 03/29/23 1100 03/29/23 1200 03/29/23 1300  BP: 126/80 123/68 128/71 123/71  Pulse: 93 92 92 91  Resp: (!) 35 (!) 30 (!) 34 (!) 24  Temp: 100 F (37.8 C) (!) 100.4 F (38 C) (!) 100.4 F (38 C) (!) 100.6 F (38.1 C)  TempSrc:      SpO2: 98% 98% 96% 96%  Weight:      Height:       General exam: awake, alert, no acute distress HEENT: moist mucus membranes, hearing grossly normal  Respiratory system: CTAB no wheezes, rales or rhonchi, normal respiratory effort. On 4 L/in HF  O2 Cardiovascular system: normal S1/S2, RRR, no JVD, murmurs, rubs, gallops, no pedal edema.   Gastrointestinal system: soft, NT, ND, no HSM felt, +bowel sounds. Central nervous system: A&O x 2+. no gross focal neurologic deficits, normal speech Extremities: moves all , no edema, normal tone Skin: dry, intact, normal temperature Psychiatry: normal mood, congruent affect  Data Reviewed:  Notable labs --   Na 134 Bicarb 20 BUN 94 Cr 2.87 improving Ca 8.1 Phos 5.6 Mg 2.7 Albumin 1.9  WBC 20.2  Family Communication: wife updated by phone today   Disposition: Status is: Inpatient Remains inpatient appropriate because: severity of illness as above, still weaning down oxygen,  remains on IV therapies   Planned Discharge Destination: Home with Home Health    Time spent: 52 minutes including time at bedside and in coordination of care  Author: Pennie Banter, DO 03/29/2023 1:50 PM  For on call review [email or web address].

## 2023-03-29 NOTE — Progress Notes (Signed)
[facility] [place], [place] 03/29/23  Subjective:   Hospital day # 3 John Peterson is a 78 y.o. male With medical problems of cutaneous T-cell lymphoma, hyperlipidemia, hypertension, seizure disorder, right total knee arthroplasty, peripheral vascular disease, GERD. He presented to the emergency room for shortness of breath and tested positive for influenza A.  Briefly intubated.  He was also diagnosed with right lower lobe pneumonia (strep pneumo).  Hospital course is complicated by acute renal failure  Today he feels a little better.  Urine output remains good as noted below. Trying to eat some breakfast this morning.  Family at bedside.   Renal: 02/18 0701 - 02/19 0700 In: 338.3 [I.V.:6.9; NG/GT:331.3] Out: 2200 [Urine:2200] Lab Results  Component Value Date   CREATININE 2.87 (H) 03/29/2023   CREATININE 3.38 (H) 03/28/2023   CREATININE 2.12 (H) 03/27/2023     Objective:  Vital signs in last 24 hours:  Temp:  [99 F (37.2 C)-100.6 F (38.1 C)] 100.4 F (38 C) (02/19 1100) Pulse Rate:  [89-102] 92 (02/19 1100) Resp:  [17-35] 30 (02/19 1100) BP: (116-141)/(64-103) 123/68 (02/19 1100) SpO2:  [94 %-100 %] 98 % (02/19 1100) FiO2 (%):  [45 %-50 %] 45 % (02/19 0751) Weight:  [107.9 kg] 107.9 kg (02/19 0500)  Weight change: 12.2 kg Filed Weights   03/26/23 0931 03/28/23 0500 03/29/23 0500  Weight: 99.8 kg 95.7 kg 107.9 kg    Intake/Output:    Intake/Output Summary (Last 24 hours) at 03/29/2023 1149 Last data filed at 03/29/2023 0514 Gross per 24 hour  Intake 272 ml  Output 1970 ml  Net -1698 ml     Physical Exam: General Appearance-elderly gentleman, sitting up in the bed HEENT-anicteric, moist oral mucous membranes Pulmonary-nasal cannula oxygen, Coarse breath sounds bilaterally Cardiac-tachycardic Abdomen-soft, nontender Extremities-trace edema Neuro-alert, able to follow simple commands Skin-no acute rashes noted  Foley catheter in  place  Basic Metabolic Panel:  Recent Labs  Lab 03/26/23 0934 03/27/23 0512 03/28/23 0459 03/29/23 0458  NA 132* 135 136 134*  K 3.8 4.2 4.4 4.4  CL 99 101 103 104  CO2 17* 19* 21* 20*  GLUCOSE 122* 90 77 82  BUN 55* 68* 92* 94*  CREATININE 2.07* 2.12* 3.38* 2.87*  CALCIUM 9.0 8.5* 8.2* 8.1*  MG  --   --  2.7* 2.7*  PHOS  --   --  6.3* 5.6*     CBC: Recent Labs  Lab 03/26/23 0934 03/27/23 0512 03/28/23 0459 03/29/23 0458  WBC 8.8 15.8* 19.6* 20.2*  HGB 14.9 14.6 12.5* 13.5  HCT 42.5 44.3 36.8* 38.7*  MCV 92.8 96.9 93.2 90.4  PLT 251 251 228 237     No results found for: "HEPBSAG", "HEPBSAB", "HEPBIGM"    Microbiology:  Recent Results (from the past 240 hours)  Resp panel by RT-PCR (RSV, Flu A&B, Covid) Anterior Nasal Swab     Status: Abnormal   Collection Time: 03/26/23  9:34 AM   Specimen: Anterior Nasal Swab  Result Value Ref Range Status   SARS Coronavirus 2 by RT PCR NEGATIVE NEGATIVE Final    Comment: (NOTE) SARS-CoV-2 target nucleic acids are NOT DETECTED.  The SARS-CoV-2 RNA is generally detectable in upper respiratory specimens during the acute phase of infection. The lowest concentration of SARS-CoV-2 viral copies this assay can detect is 138 copies/mL. A negative result does not preclude SARS-Cov-2 infection and should not be used as the sole basis for treatment or other patient management decisions. A negative  result may occur with  improper specimen collection/handling, submission of specimen other than nasopharyngeal swab, presence of viral mutation(s) within the areas targeted by this assay, and inadequate number of viral copies(<138 copies/mL). A negative result must be combined with clinical observations, patient history, and epidemiological information. The expected result is Negative.  Fact Sheet for Patients:  BloggerCourse.com  Fact Sheet for Healthcare Providers:   SeriousBroker.it  This test is no t yet approved or cleared by the [place] FDA and  has been authorized for detection and/or diagnosis of SARS-CoV-2 by FDA under an Emergency Use Authorization (EUA). This EUA will remain  in effect (meaning this test can be used) for the duration of the COVID-19 declaration under Section 564(b)(1) of the Act, 21 U.S.C.section 360bbb-3(b)(1), unless the authorization is terminated  or revoked sooner.       Influenza A by PCR POSITIVE (A) NEGATIVE Final   Influenza B by PCR NEGATIVE NEGATIVE Final    Comment: (NOTE) The Xpert Xpress SARS-CoV-2/FLU/RSV plus assay is intended as an aid in the diagnosis of influenza from Nasopharyngeal swab specimens and should not be used as a sole basis for treatment. Nasal washings and aspirates are unacceptable for Xpert Xpress SARS-CoV-2/FLU/RSV testing.  Fact Sheet for Patients: BloggerCourse.com  Fact Sheet for Healthcare Providers: SeriousBroker.it  This test is not yet approved or cleared by the [place] FDA and has been authorized for detection and/or diagnosis of SARS-CoV-2 by FDA under an Emergency Use Authorization (EUA). This EUA will remain in effect (meaning this test can be used) for the duration of the COVID-19 declaration under Section 564(b)(1) of the Act, 21 U.S.C. section 360bbb-3(b)(1), unless the authorization is terminated or revoked.     Resp Syncytial Virus by PCR NEGATIVE NEGATIVE Final    Comment: (NOTE) Fact Sheet for Patients: BloggerCourse.com  Fact Sheet for Healthcare Providers: SeriousBroker.it  This test is not yet approved or cleared by the [place] FDA and has been authorized for detection and/or diagnosis of SARS-CoV-2 by FDA under an Emergency Use Authorization (EUA). This EUA will remain in effect (meaning this test can be used)  for the duration of the COVID-19 declaration under Section 564(b)(1) of the Act, 21 U.S.C. section 360bbb-3(b)(1), unless the authorization is terminated or revoked.  Performed at [facility], [address] [place], [place] [address]   Respiratory (~20 pathogens) panel by PCR     Status: Abnormal   Collection Time: 03/27/23  6:48 AM   Specimen: Nasopharyngeal Swab; Respiratory  Result Value Ref Range Status   Adenovirus NOT DETECTED NOT DETECTED Final   Coronavirus 229E NOT DETECTED NOT DETECTED Final    Comment: (NOTE) The Coronavirus on the Respiratory Panel, DOES NOT test for the novel  Coronavirus (2019 nCoV)    Coronavirus HKU1 NOT DETECTED NOT DETECTED Final   Coronavirus NL63 NOT DETECTED NOT DETECTED Final   Coronavirus OC43 NOT DETECTED NOT DETECTED Final   Metapneumovirus NOT DETECTED NOT DETECTED Final   Rhinovirus / Enterovirus NOT DETECTED NOT DETECTED Final   Influenza A H1 2009 DETECTED (A) NOT DETECTED Final   Influenza B NOT DETECTED NOT DETECTED Final   Parainfluenza Virus 1 NOT DETECTED NOT DETECTED Final   Parainfluenza Virus 2 NOT DETECTED NOT DETECTED Final   Parainfluenza Virus 3 NOT DETECTED NOT DETECTED Final   Parainfluenza Virus 4 NOT DETECTED NOT DETECTED Final   Respiratory Syncytial Virus NOT DETECTED NOT DETECTED Final   Bordetella pertussis NOT DETECTED NOT DETECTED Final  Bordetella Parapertussis NOT DETECTED NOT DETECTED Final   Chlamydophila pneumoniae NOT DETECTED NOT DETECTED Final   Mycoplasma pneumoniae NOT DETECTED NOT DETECTED Final    Comment: Performed at [facility], [address], [place], [place] [address]  MRSA Next Gen by PCR, Nasal     Status: None   Collection Time: 03/27/23  8:26 AM   Specimen: Nasal Mucosa; Nasal Swab  Result Value Ref Range Status   MRSA by PCR Next Gen NOT DETECTED NOT DETECTED Final    Comment: (NOTE) The GeneXpert MRSA Assay (FDA approved for NASAL specimens only), is one component  of a comprehensive MRSA colonization surveillance program. It is not intended to diagnose MRSA infection nor to guide or monitor treatment for MRSA infections. Test performance is not FDA approved in patients less than 84 years old. Performed at [facility], [address] [place], [place] [address]   Culture, Respiratory w Gram Stain     Status: None (Preliminary result)   Collection Time: 03/28/23  4:38 AM   Specimen: SPU  Result Value Ref Range Status   Specimen Description   Final    SPUTUM Performed at [facility], [address] [place], [place] [address]    Special Requests   Final    NONE Performed at [facility], [address] [place], [place] [address]    Gram Stain   Final    FEW WBC PRESENT, PREDOMINANTLY PMN RARE GRAM POSITIVE COCCI IN PAIRS Performed at [facility], [address] [place], [place] [address]    Culture PENDING  Incomplete   Report Status PENDING  Incomplete  Culture, blood (Routine X 2) w Reflex to ID Panel     Status: None (Preliminary result)   Collection Time: 03/28/23  6:18 AM   Specimen: BLOOD  Result Value Ref Range Status   Specimen Description BLOOD BLOOD RIGHT ARM  Final   Special Requests   Final    BOTTLES DRAWN AEROBIC AND ANAEROBIC Blood Culture adequate volume   Culture   Final    NO GROWTH < 24 HOURS Performed at [facility], [address], [place], [place] [address]    Report Status PENDING  Incomplete  Culture, blood (Routine X 2) w Reflex to ID Panel     Status: None (Preliminary result)   Collection Time: 03/28/23  6:18 AM   Specimen: BLOOD  Result Value Ref Range Status   Specimen Description BLOOD BLOOD RIGHT HAND ANAEROBIC BOTTLE ONLY  Final   Special Requests   Final    BOTTLES DRAWN AEROBIC ONLY Blood Culture adequate volume   Culture   Final    NO GROWTH < 24 HOURS Performed at [facility], [address], [place], [place] [address]    Report  Status PENDING  Incomplete    Coagulation Studies: No results for input(s): "LABPROT", "INR" in the last 72 hours.  Urinalysis: Recent Labs    03/28/23 1624  COLORURINE YELLOW*  LABSPEC 1.012  PHURINE 5.0  GLUCOSEU NEGATIVE  HGBUR MODERATE*  BILIRUBINUR NEGATIVE  KETONESUR NEGATIVE  PROTEINUR NEGATIVE  NITRITE NEGATIVE  LEUKOCYTESUR NEGATIVE      Imaging: DG Chest 1 View Result Date: 03/28/2023 CLINICAL DATA:  78 year old male "pulmonary edema", intubated. EXAM: CHEST  1 VIEW COMPARISON:  Portable chest 03/27/2023 and earlier. FINDINGS: Portable AP upright view at 0554 hours. Stable visible endotracheal and enteric tubes. Moderate veiling opacity in the right lower lung is stable  from the CT on 03/26/2023 which demonstrated combination of consolidated right lower lobe, small superimposed right pleural effusion. Small contralateral left pleural effusion now suspected and stable since yesterday. Stable cardiac size and mediastinal contours. No pneumothorax. Aerated upper lung pulmonary vascularity appears normal. Paucity of bowel gas. Stable visualized osseous structures. IMPRESSION: 1.  Stable lines and tubes. 2. No improvement in severe right lower lobe consolidation since the CT on 03/26/2023. Small volume superimposed pleural effusions. No evidence of pulmonary edema. Electronically Signed   By: Odessa Fleming M.D.   On: 03/28/2023 06:26   [place] CHEST (PLEURAL EFFUSION) Result Date: 03/27/2023 CLINICAL DATA:  Limited ultrasound of the chest done prior to possible thoracentesis. EXAM: CHEST ULTRASOUND COMPARISON:  Chest XR, 03/27/2023 FINDINGS: There is only a small pleural effusion present with no window to allow for safe approach for thoracentesis. IMPRESSION: Small pleural effusion. Thoracentesis was NOT performed Performed by Buzzy Han, PA-C Electronically Signed   By: Roanna Banning M.D.   On: 03/27/2023 17:13     Medications:    azithromycin     cefTRIAXone (ROCEPHIN)  IV 2 g  (03/28/23 2101)    budesonide (PULMICORT) nebulizer solution  0.25 mg Nebulization BID   Chlorhexidine Gluconate Cloth  6 each Topical Daily   dextrose  12.5 g Intravenous STAT   dextrose  1 ampule Intravenous Once   docusate sodium  100 mg Oral BID   enoxaparin (LOVENOX) injection  50 mg Subcutaneous Q24H   feeding supplement  237 mL Oral TID BM   folic acid  1 mg Oral Daily   multivitamin with minerals  1 tablet Oral Daily   oseltamivir  30 mg Oral Daily   pantoprazole  40 mg Oral Q2000   polyethylene glycol  17 g Oral Daily   revefenacin  175 mcg Nebulization Daily   thiamine  100 mg Oral Daily   acetaminophen, ipratropium-albuterol, ondansetron **OR** ondansetron (ZOFRAN) IV, mouth rinse  Assessment/ Plan:  78 y.o. male with   medical problems of  cutaneous T-cell lymphoma, hyperlipidemia, hypertension, seizure disorder, right total knee arthroplasty, peripheral vascular disease, GERD admitted on 03/26/2023 for Shortness of breath [R06.02] Pleural effusion [J90] Acute respiratory failure with hypoxia (HCC) [J96.01] AKI (acute kidney injury) (HCC) [N17.9] Influenza [J11.1]  1.  Acute kidney injury, hematuria, proteinuria Patient's baseline creatinine is normal at 0.9 from 03/21/2023. Today's creatinine has has improved and urine output is good. Urinalysis with moderate hemoglobin, 11-20 RBCs, 11-20 WBCs, UPC of 0.63 g. Imaging-renal ultrasound-no acute findings. Foley catheter in place with dark yellow urine AKI likely secondary to ATN from concurrent illness/pneumonia/influenza A. Patient has mixed acidosis, increased BUN, elevated lactic acid. Potassium is in the normal range Electrolytes and volume status are acceptable at this time.  No acute indication for dialysis.  We will monitor renal function daily. In light of hematuria detected on urinalysis, will order ANCA panel.   2.  Acute respiratory failure, hypoxic Influenza A positive Right lower lobe  pneumonia-streptococcal pneumonia Currently getting treatment with Tamiflu, ceftriaxone and azithromycin. Management as per ICU team.     LOS: 3 Carlisia Geno Thedore Mins 2/19/202511:49 AM  [facility] [place], [place] [phone]  Note: This note was prepared with Dragon dictation. Any transcription errors are unintentional

## 2023-03-29 NOTE — Plan of Care (Signed)

## 2023-03-29 NOTE — Progress Notes (Signed)
Hypoglycemic Event  CBG: 65  Treatment: 8 OZ orange juice  Symptoms: None  Follow-up CBG: Time:0004  CBG Result:79  Possible Reasons for Event: Inadequate meal intake  Comments/MD notified:coverage     John Peterson Michel Bickers

## 2023-03-29 NOTE — Progress Notes (Signed)
PHARMACY CONSULT NOTE - FOLLOW UP  Pharmacy Consult for Electrolyte Monitoring and Replacement   Recent Labs: Potassium (mmol/L)  Date Value  03/29/2023 4.4   Magnesium (mg/dL)  Date Value  78/29/5621 2.7 (H)   Calcium (mg/dL)  Date Value  30/86/5784 8.1 (L)   Albumin (g/dL)  Date Value  69/62/9528 1.9 (L)   Phosphorus (mg/dL)  Date Value  41/32/4401 5.6 (H)   Sodium (mmol/L)  Date Value  03/29/2023 134 (L)   Assessment: John Peterson is a 78 yo male who presented to the ED with complaints of progressive shortness of breath. Past medical history significant for CTCL (cutaneous T-cell cell lymphoma), hyperlipidemia, hypertension, seizure, right TKA, PVD, and GERD. They just came back from a cruise where their wife tested positive for flu - they also have tested positive for flu A. Patient was extubated on 2/18 and is at risk for refeed risk. Pharmacy has been consulted to manage this patient's electrolytes while they're admitted to the ICU.   Fluids: Free water 30 mL Q4H   Goal of Therapy:  Electrolytes WNL Today, 03/29/2023 K = 4.4 Na = 134 >> 136 (hx of chronically running low) Mg = 2.7 Phos = 5.6  Plan:  No replacement indicated at this time Continue to monitor with AM labs daily   Effie Shy, PharmD Pharmacy Resident  03/29/2023 6:34 AM

## 2023-03-30 DIAGNOSIS — J9601 Acute respiratory failure with hypoxia: Secondary | ICD-10-CM | POA: Diagnosis not present

## 2023-03-30 LAB — RENAL FUNCTION PANEL
Albumin: 1.9 g/dL — ABNORMAL LOW (ref 3.5–5.0)
Anion gap: 9 (ref 5–15)
BUN: 83 mg/dL — ABNORMAL HIGH (ref 8–23)
CO2: 23 mmol/L (ref 22–32)
Calcium: 8 mg/dL — ABNORMAL LOW (ref 8.9–10.3)
Chloride: 105 mmol/L (ref 98–111)
Creatinine, Ser: 2.13 mg/dL — ABNORMAL HIGH (ref 0.61–1.24)
GFR, Estimated: 31 mL/min — ABNORMAL LOW (ref >60)
Glucose, Bld: 77 mg/dL (ref 70–99)
Phosphorus: 4.3 mg/dL (ref 2.5–4.6)
Potassium: 4.4 mmol/L (ref 3.5–5.1)
Sodium: 137 mmol/L (ref 135–145)

## 2023-03-30 LAB — CULTURE, RESPIRATORY W GRAM STAIN

## 2023-03-30 LAB — CBC
HCT: 40.8 % (ref 39.0–52.0)
Hemoglobin: 13.8 g/dL (ref 13.0–17.0)
MCH: 31.9 pg (ref 26.0–34.0)
MCHC: 33.8 g/dL (ref 30.0–36.0)
MCV: 94.4 fL (ref 80.0–100.0)
Platelets: 108 10*3/uL — ABNORMAL LOW (ref 150–400)
RBC: 4.32 MIL/uL (ref 4.22–5.81)
RDW: 15.3 % (ref 11.5–15.5)
WBC: 13.3 10*3/uL — ABNORMAL HIGH (ref 4.0–10.5)
nRBC: 0 % (ref 0.0–0.2)

## 2023-03-30 LAB — MAGNESIUM: Magnesium: 3 mg/dL — ABNORMAL HIGH (ref 1.7–2.4)

## 2023-03-30 LAB — GLUCOSE, CAPILLARY
Glucose-Capillary: 75 mg/dL (ref 70–99)
Glucose-Capillary: 79 mg/dL (ref 70–99)

## 2023-03-30 MED ORDER — ALLOPURINOL 100 MG PO TABS
100.0000 mg | ORAL_TABLET | Freq: Every day | ORAL | Status: DC
  Administered 2023-03-30 – 2023-04-05 (×7): 100 mg via ORAL
  Filled 2023-03-30 (×7): qty 1

## 2023-03-30 MED ORDER — OSELTAMIVIR PHOSPHATE 30 MG PO CAPS
30.0000 mg | ORAL_CAPSULE | Freq: Two times a day (BID) | ORAL | Status: AC
  Administered 2023-03-30 – 2023-03-31 (×3): 30 mg via ORAL
  Filled 2023-03-30 (×4): qty 1

## 2023-03-30 MED ORDER — ORAL CARE MOUTH RINSE: 15.0000 mL | OROMUCOSAL | Status: DC | PRN

## 2023-03-30 NOTE — Progress Notes (Signed)
Nutrition Follow Up Note   DOCUMENTATION CODES:   Obesity unspecified  INTERVENTION:   Ensure Enlive po TID, each supplement provides 350 kcal and 20 grams of protein.  MVI po daily   Pt remains at high refeed risk; recommend monitor potassium, magnesium and phosphorus labs daily until stable  Daily weights   NUTRITION DIAGNOSIS:   Inadequate oral intake related to inability to eat (pt sedated and ventilated) as evidenced by NPO status. -ongoing   GOAL:   Patient will meet greater than or equal to 90% of their needs -not met   MONITOR:   PO intake, Supplement acceptance, Labs, Weight trends, Skin, I & O's  ASSESSMENT:   78 y/o male with h/o HTN, HLD, PVD, GERD, CTCL, reported daily etoh use, R TKA and seizures who is admitted with PNA, sepsis, flu A and AKI.  Pt initiated on an oral diet 2/18. Pt is documented to be eating <25% of meals in hospital. Per RN, pt drank an Ensure with breakfast this morning (likes chocolate). Recommend encourage intake of meals and supplements. Wife at bedside helping pt to order foods he likes. Pt remains at refeed risk. May need to consider NGT placement and nutrition support if pt's oral intake does not improve. Per chart, pt appears to be around his UBW.   Medications reviewed and include: allopurinol, colace, folic acid, MVI, protonix, miralax, thiamine, azithromycin, ceftriaxone  Labs reviewed: K 4.4 wnl, BUN 83(H), creat 2.13(H), P 4.3 wnl, Mg 3.0(H) Wbc- 13.3(H) Cbgs- 95, 79 x 48 hrs   Diet Order:   Diet Order             Diet regular Room service appropriate? Yes; Fluid consistency: Thin  Diet effective now                  EDUCATION NEEDS:   No education needs have been identified at this time  Skin:  Skin Assessment: Reviewed RN Assessment  Last BM:  2/19  Height:   Ht Readings from Last 1 Encounters:  03/26/23 5\' 10"  (1.778 m)    Weight:   Wt Readings from Last 1 Encounters:  03/30/23 101.8 kg    Ideal  Body Weight:  75.45 kg  BMI:  Body mass index is 32.2 kg/m.  Estimated Nutritional Needs:   Kcal:  2000-2300kcal/day  Protein:  100-115g/day  Fluid:  1.9-2.2L/day  Betsey Holiday MS, RD, LDN If unable to be reached, please send secure chat to "RD inpatient" available from 8:00a-4:00p daily

## 2023-03-30 NOTE — Progress Notes (Signed)
Patient being transferred to room 210. Report given to [employer]. Patient is on 3 liters. Regular diet and using urinal. Blood sugar 75- orange juice given and patients dinner tray in room. Wife called and updated.

## 2023-03-30 NOTE — Evaluation (Signed)
Occupational Therapy Evaluation Patient Details Name: John Peterson MRN: [id number] DOB: Jul 06, 1945 Today's Date: 03/30/2023   History of Present Illness   Pt is a 78 year old male admitted with Acute respiratory failure with hypoxia and hypercapnia  Influenza A,Secondary to strep pneumoniae pneumonia,Right pleural effusion  Sepsis due to flu and pneumonia, AKI due to ATN, hypoglycemia, Acute metabolic encephalopathy       PMH significant for CTCL (cutaneous T-cell cell lymphoma), hyperlipidemia, hypertension, seizure, right TKA, PVD, and GERD     Clinical Impressions Chart reviewed, pt greeted in bed, alert and oriented to self, place, grossly to date and situation but appears confused at times, increased time required for one step direction following. PTA pt is indep in ADL/IADL, amb with no AD; Pt presents with deficits in strength, endurance, activity tolerance, balance, cognition affecting safe and optimal Adl completion. Bed mobility completed with MIN A, STS MIN-MOD A multiple attempts requiring +2, then +1 with RW, frequent vcs for technique. Transfer to bsc completed with MIN A with RW, MAX A for peri care after continent BM. Pt amb in room approx 10' with RW with CGA. Pt on 4L via [place] throughout, spo2 90% during mobility. Discussed discharge recommendations and DME recommendations with pt/family with pt goal to return home with family help if possible. OT will follow acutely.     If plan is discharge home, recommend the following:   A little help with walking and/or transfers;A little help with bathing/dressing/bathroom;Direct supervision/assist for medications management;Assistance with cooking/housework     Functional Status Assessment   Patient has had a recent decline in their functional status and demonstrates the ability to make significant improvements in function in a reasonable and predictable amount of time.     Equipment Recommendations   BSC/3in1;Other  (comment);Tub/shower seat (2WW)     Recommendations for Other Services         Precautions/Restrictions   Precautions Precautions: Fall Restrictions Weight Bearing Restrictions Per Provider Order: No     Mobility Bed Mobility Overal bed mobility: Needs Assistance Bed Mobility: Supine to Sit     Supine to sit: Min assist, HOB elevated          Transfers Overall transfer level: Needs assistance Equipment used: Rolling walker (2 wheels) Transfers: Sit to/from Stand Sit to Stand: Min assist, Mod assist (+1-2)                  Balance Overall balance assessment: Needs assistance Sitting-balance support: Feet supported Sitting balance-Leahy Scale: Good     Standing balance support: Bilateral upper extremity supported, During functional activity, Reliant on assistive device for balance Standing balance-Leahy Scale: Fair                             ADL either performed or assessed with clinical judgement   ADL Overall ADL's : Needs assistance/impaired Eating/Feeding: Set up;Sitting   Grooming: Set up;Sitting;Wash/dry face       Lower Body Bathing: Moderate assistance   Upper Body Dressing : Minimal assistance;Sitting Upper Body Dressing Details (indicate cue type and reason): doff/donn gown Lower Body Dressing: Maximal assistance;Sit to/from stand;Sitting/lateral leans Lower Body Dressing Details (indicate cue type and reason): after continent BM Toilet Transfer: Minimal assistance;+2 for safety/equipment;+2 for physical assistance;Rolling walker (2 wheels);BSC/3in1 Toilet Transfer Details (indicate cue type and reason): short amb transfer Toileting- Clothing Manipulation and Hygiene: Maximal assistance;Sitting/lateral lean;Sit to/from stand Toileting - Clothing Manipulation Details (indicate cue  type and reason): after continent BM on toilet; pt incontinent of urine after standing (foley recently pulled)     Functional mobility during  ADLs: Minimal assistance;Rolling walker (2 wheels) (approx 10' with RW MIN A +2)       Vision Patient Visual Report: No change from baseline       Perception         Praxis         Pertinent Vitals/Pain Pain Assessment Pain Assessment: No/denies pain     Extremity/Trunk Assessment Upper Extremity Assessment Upper Extremity Assessment: Generalized weakness   Lower Extremity Assessment Lower Extremity Assessment: Generalized weakness   Cervical / Trunk Assessment Cervical / Trunk Assessment: Normal   Communication Communication Communication: No apparent difficulties   Cognition Arousal: Alert Behavior During Therapy: Flat affect Cognition: Cognition impaired   Orientation impairments: Situation   Memory impairment (select all impairments): Declarative long-term memory   Executive functioning impairment (select all impairments): Problem solving                   Following commands: Impaired Following commands impaired: Follows one step commands with increased time     Cueing  General Comments   Cueing Techniques: Verbal cues;Tactile cues;Visual cues      Exercises Other Exercises Other Exercises: edu pt/family re: role of OT, role of rehab, discharge recommendations, DME use at home   Shoulder Instructions      Home Living Family/patient expects to be discharged to:: Private residence Living Arrangements: Spouse/significant other Available Help at Discharge: Family Type of Home: Other(Comment) (townhouse) Home Access: Level entry     Home Layout: Able to live on main level with bedroom/bathroom         Bathroom Toilet: Standard     Home Equipment: Agricultural consultant (2 wheels);Grab bars - tub/shower          Prior Functioning/Environment Prior Level of Function : Independent/Modified Independent             Mobility Comments: amb with no AD ADLs Comments: MOD I-I In ADL/IADL    OT Problem List: Decreased activity  tolerance;Decreased strength;Impaired balance (sitting and/or standing);Decreased cognition;Decreased knowledge of use of DME or AE;Decreased safety awareness   OT Treatment/Interventions: Self-care/ADL training;Therapeutic exercise;Patient/family education;Balance training;Energy conservation;Therapeutic activities;DME and/or AE instruction      OT Goals(Current goals can be found in the care plan section)   Acute Rehab OT Goals Patient Stated Goal: go home OT Goal Formulation: With patient/family Time For Goal Achievement: 04/13/23 Potential to Achieve Goals: Good ADL Goals Pt Will Perform Grooming: with modified independence;sitting Pt Will Perform Lower Body Dressing: with modified independence;sit to/from stand;sitting/lateral leans Pt Will Transfer to Toilet: with modified independence;ambulating Pt Will Perform Toileting - Clothing Manipulation and hygiene: with modified independence;sitting/lateral leans;sit to/from stand   OT Frequency:  Min 1X/week    Co-evaluation PT/OT/SLP Co-Evaluation/Treatment: Yes     OT goals addressed during session: ADL's and self-care      AM-PAC OT "6 Clicks" Daily Activity     Outcome Measure Help from another person eating meals?: None Help from another person taking care of personal grooming?: None Help from another person toileting, which includes using toliet, bedpan, or urinal?: A Lot Help from another person bathing (including washing, rinsing, drying)?: A Lot Help from another person to put on and taking off regular upper body clothing?: A Little Help from another person to put on and taking off regular lower body clothing?: A Lot 6 Click Score: 17  End of Session Equipment Utilized During Treatment: Rolling walker (2 wheels);Oxygen Nurse Communication: Mobility status  Activity Tolerance: Patient tolerated treatment well Patient left: in chair;with call bell/phone within reach;with family/visitor present  OT Visit Diagnosis:  Other abnormalities of gait and mobility (R26.89);Muscle weakness (generalized) (M62.81);Unsteadiness on feet (R26.81)                Time: 2956-2130 OT Time Calculation (min): 40 min Charges:  OT General Charges $OT Visit: 1 Visit OT Evaluation $OT Eval Moderate Complexity: 1 Mod Oleta Mouse, OTD OTR/L  03/30/23, 12:40 PM

## 2023-03-30 NOTE — Progress Notes (Signed)
[facility] [place], [place] 03/30/23  Subjective:   Hospital day # 4 John Peterson is a 78 y.o. male With medical problems of cutaneous T-cell lymphoma, hyperlipidemia, hypertension, seizure disorder, right total knee arthroplasty, peripheral vascular disease, GERD. He presented to the emergency room for shortness of breath and tested positive for influenza A.  Briefly intubated.  He was also diagnosed with right lower lobe pneumonia (strep pneumo).  Hospital course is complicated by acute renal failure  Today he feels good. Sitting up in chair at bedside.  Urine output remains good as noted below. Wife at bedside.   Renal: 02/19 0701 - 02/20 0700 In: 450 [IV Piggyback:450] Out: 2500 [Urine:2500] Lab Results  Component Value Date   CREATININE 2.13 (H) 03/30/2023   CREATININE 2.87 (H) 03/29/2023   CREATININE 3.38 (H) 03/28/2023     Objective:  Vital signs in last 24 hours:  Temp:  [98.6 F (37 C)-100.6 F (38.1 C)] 99 F (37.2 C) (02/20 1123) Pulse Rate:  [82-98] 85 (02/20 1200) Resp:  [24-33] 32 (02/20 1200) BP: (113-142)/(65-89) 133/74 (02/20 1200) SpO2:  [94 %-98 %] 97 % (02/20 1200) Weight:  [101.8 kg] 101.8 kg (02/20 0339)  Weight change: -6.1 kg Filed Weights   03/28/23 0500 03/29/23 0500 03/30/23 0339  Weight: 95.7 kg 107.9 kg 101.8 kg    Intake/Output:    Intake/Output Summary (Last 24 hours) at 03/30/2023 1337 Last data filed at 03/30/2023 1200 Gross per 24 hour  Intake 550 ml  Output 3300 ml  Net -2750 ml     Physical Exam: General Appearance-elderly gentleman,   HEENT-anicteric, moist oral mucous membranes Pulmonary-nasal cannula oxygen,  Cardiac-tachycardic Abdomen-soft, nontender Extremities-trace edema Neuro-alert, able to follow simple commands Skin-no acute rashes noted   Basic Metabolic Panel:  Recent Labs  Lab 03/26/23 0934 03/27/23 0512 03/28/23 0459 03/29/23 0458 03/30/23 0428  NA 132* 135 136 134* 137   K 3.8 4.2 4.4 4.4 4.4  CL 99 101 103 104 105  CO2 17* 19* 21* 20* 23  GLUCOSE 122* 90 77 82 77  BUN 55* 68* 92* 94* 83*  CREATININE 2.07* 2.12* 3.38* 2.87* 2.13*  CALCIUM 9.0 8.5* 8.2* 8.1* 8.0*  MG  --   --  2.7* 2.7* 3.0*  PHOS  --   --  6.3* 5.6* 4.3     CBC: Recent Labs  Lab 03/26/23 0934 03/27/23 0512 03/28/23 0459 03/29/23 0458 03/30/23 0428  WBC 8.8 15.8* 19.6* 20.2* 13.3*  HGB 14.9 14.6 12.5* 13.5 13.8  HCT 42.5 44.3 36.8* 38.7* 40.8  MCV 92.8 96.9 93.2 90.4 94.4  PLT 251 251 228 237 108*     No results found for: "HEPBSAG", "HEPBSAB", "HEPBIGM"    Microbiology:  Recent Results (from the past 240 hours)  Resp panel by RT-PCR (RSV, Flu A&B, Covid) Anterior Nasal Swab     Status: Abnormal   Collection Time: 03/26/23  9:34 AM   Specimen: Anterior Nasal Swab  Result Value Ref Range Status   SARS Coronavirus 2 by RT PCR NEGATIVE NEGATIVE Final    Comment: (NOTE) SARS-CoV-2 target nucleic acids are NOT DETECTED.  The SARS-CoV-2 RNA is generally detectable in upper respiratory specimens during the acute phase of infection. The lowest concentration of SARS-CoV-2 viral copies this assay can detect is 138 copies/mL. A negative result does not preclude SARS-Cov-2 infection and should not be used as the sole basis for treatment or other patient management decisions. A negative result may occur with  improper specimen collection/handling, submission of specimen other than nasopharyngeal swab, presence of viral mutation(s) within the areas targeted by this assay, and inadequate number of viral copies(<138 copies/mL). A negative result must be combined with clinical observations, patient history, and epidemiological information. The expected result is Negative.  Fact Sheet for Patients:  BloggerCourse.com  Fact Sheet for Healthcare Providers:  SeriousBroker.it  This test is no t yet approved or cleared by the  [place] FDA and  has been authorized for detection and/or diagnosis of SARS-CoV-2 by FDA under an Emergency Use Authorization (EUA). This EUA will remain  in effect (meaning this test can be used) for the duration of the COVID-19 declaration under Section 564(b)(1) of the Act, 21 U.S.C.section 360bbb-3(b)(1), unless the authorization is terminated  or revoked sooner.       Influenza A by PCR POSITIVE (A) NEGATIVE Final   Influenza B by PCR NEGATIVE NEGATIVE Final    Comment: (NOTE) The Xpert Xpress SARS-CoV-2/FLU/RSV plus assay is intended as an aid in the diagnosis of influenza from Nasopharyngeal swab specimens and should not be used as a sole basis for treatment. Nasal washings and aspirates are unacceptable for Xpert Xpress SARS-CoV-2/FLU/RSV testing.  Fact Sheet for Patients: BloggerCourse.com  Fact Sheet for Healthcare Providers: SeriousBroker.it  This test is not yet approved or cleared by the [place] FDA and has been authorized for detection and/or diagnosis of SARS-CoV-2 by FDA under an Emergency Use Authorization (EUA). This EUA will remain in effect (meaning this test can be used) for the duration of the COVID-19 declaration under Section 564(b)(1) of the Act, 21 U.S.C. section 360bbb-3(b)(1), unless the authorization is terminated or revoked.     Resp Syncytial Virus by PCR NEGATIVE NEGATIVE Final    Comment: (NOTE) Fact Sheet for Patients: BloggerCourse.com  Fact Sheet for Healthcare Providers: SeriousBroker.it  This test is not yet approved or cleared by the [place] FDA and has been authorized for detection and/or diagnosis of SARS-CoV-2 by FDA under an Emergency Use Authorization (EUA). This EUA will remain in effect (meaning this test can be used) for the duration of the COVID-19 declaration under Section 564(b)(1) of the Act, 21  U.S.C. section 360bbb-3(b)(1), unless the authorization is terminated or revoked.  Performed at [facility], [address] [place], [place] [address]   Respiratory (~20 pathogens) panel by PCR     Status: Abnormal   Collection Time: 03/27/23  6:48 AM   Specimen: Nasopharyngeal Swab; Respiratory  Result Value Ref Range Status   Adenovirus NOT DETECTED NOT DETECTED Final   Coronavirus 229E NOT DETECTED NOT DETECTED Final    Comment: (NOTE) The Coronavirus on the Respiratory Panel, DOES NOT test for the novel  Coronavirus (2019 nCoV)    Coronavirus HKU1 NOT DETECTED NOT DETECTED Final   Coronavirus NL63 NOT DETECTED NOT DETECTED Final   Coronavirus OC43 NOT DETECTED NOT DETECTED Final   Metapneumovirus NOT DETECTED NOT DETECTED Final   Rhinovirus / Enterovirus NOT DETECTED NOT DETECTED Final   Influenza A H1 2009 DETECTED (A) NOT DETECTED Final   Influenza B NOT DETECTED NOT DETECTED Final   Parainfluenza Virus 1 NOT DETECTED NOT DETECTED Final   Parainfluenza Virus 2 NOT DETECTED NOT DETECTED Final   Parainfluenza Virus 3 NOT DETECTED NOT DETECTED Final   Parainfluenza Virus 4 NOT DETECTED NOT DETECTED Final   Respiratory Syncytial Virus NOT DETECTED NOT DETECTED Final   Bordetella pertussis NOT DETECTED NOT DETECTED Final   Bordetella Parapertussis NOT DETECTED  NOT DETECTED Final   Chlamydophila pneumoniae NOT DETECTED NOT DETECTED Final   Mycoplasma pneumoniae NOT DETECTED NOT DETECTED Final    Comment: Performed at [facility], [address], [place], [place] [address]  MRSA Next Gen by PCR, Nasal     Status: None   Collection Time: 03/27/23  8:26 AM   Specimen: Nasal Mucosa; Nasal Swab  Result Value Ref Range Status   MRSA by PCR Next Gen NOT DETECTED NOT DETECTED Final    Comment: (NOTE) The GeneXpert MRSA Assay (FDA approved for NASAL specimens only), is one component of a comprehensive MRSA colonization surveillance program. It is not intended to  diagnose MRSA infection nor to guide or monitor treatment for MRSA infections. Test performance is not FDA approved in patients less than 49 years old. Performed at [facility], [address] [place], [place] [address]   Culture, Respiratory w Gram Stain     Status: None (Preliminary result)   Collection Time: 03/28/23  4:38 AM   Specimen: SPU  Result Value Ref Range Status   Specimen Description   Final    SPUTUM Performed at [facility], [address], [place], [place] [address]    Special Requests   Final    NONE Performed at [facility], [address] [place], [place] [address]    Gram Stain   Final    FEW WBC PRESENT, PREDOMINANTLY PMN RARE GRAM POSITIVE COCCI IN PAIRS    Culture   Final    NO GROWTH 1 DAY Performed at [facility], [address], [place], [place] [address]    Report Status PENDING  Incomplete  Culture, blood (Routine X 2) w Reflex to ID Panel     Status: None (Preliminary result)   Collection Time: 03/28/23  6:18 AM   Specimen: BLOOD  Result Value Ref Range Status   Specimen Description BLOOD BLOOD RIGHT ARM  Final   Special Requests   Final    BOTTLES DRAWN AEROBIC AND ANAEROBIC Blood Culture adequate volume   Culture   Final    NO GROWTH 2 DAYS Performed at [facility], [address] [place], [place] [address]    Report Status PENDING  Incomplete  Culture, blood (Routine X 2) w Reflex to ID Panel     Status: None (Preliminary result)   Collection Time: 03/28/23  6:18 AM   Specimen: BLOOD  Result Value Ref Range Status   Specimen Description BLOOD BLOOD RIGHT HAND ANAEROBIC BOTTLE ONLY  Final   Special Requests   Final    BOTTLES DRAWN AEROBIC ONLY Blood Culture adequate volume   Culture   Final    NO GROWTH 2 DAYS Performed at [facility], [address], [place], [place] [address]    Report Status PENDING  Incomplete    Coagulation Studies: No results for input(s):  "LABPROT", "INR" in the last 72 hours.  Urinalysis: Recent Labs    03/28/23 1624  COLORURINE YELLOW*  LABSPEC 1.012  PHURINE 5.0  GLUCOSEU NEGATIVE  HGBUR MODERATE*  BILIRUBINUR NEGATIVE  KETONESUR NEGATIVE  PROTEINUR NEGATIVE  NITRITE NEGATIVE  LEUKOCYTESUR NEGATIVE      Imaging: No results found.    Medications:    azithromycin Stopped (03/29/23 2133)   cefTRIAXone (ROCEPHIN)  IV Stopped (03/29/23 2102)    allopurinol  100 mg Oral Daily   budesonide (PULMICORT) nebulizer solution  0.25 mg Nebulization BID   Chlorhexidine Gluconate Cloth  6 each Topical Daily   dextrose  1 ampule Intravenous Once   docusate sodium  100 mg Oral BID   feeding supplement  237 mL Oral TID BM   folic acid  1 mg Oral Daily   multivitamin with minerals  1 tablet Oral Daily   oseltamivir  30 mg Oral BID   pantoprazole  40 mg Oral Q2000   polyethylene glycol  17 g Oral Daily   revefenacin  175 mcg Nebulization Daily   thiamine  100 mg Oral Daily   acetaminophen, ipratropium-albuterol, ondansetron **OR** ondansetron (ZOFRAN) IV, mouth rinse  Assessment/ Plan:  78 y.o. male with   medical problems of  cutaneous T-cell lymphoma, hyperlipidemia, hypertension, seizure disorder, right total knee arthroplasty, peripheral vascular disease, GERD admitted on 03/26/2023 for Shortness of breath [R06.02] Pleural effusion [J90] Acute respiratory failure with hypoxia (HCC) [J96.01] AKI (acute kidney injury) (HCC) [N17.9] Influenza [J11.1]  1.  Acute kidney injury, hematuria, proteinuria Patient's baseline creatinine is normal at 0.9 from 03/21/2023. Today's creatinine has has improved and urine output is good. Urinalysis with moderate hemoglobin, 11-20 RBCs, 11-20 WBCs, UPC of 0.63 g. Imaging-renal ultrasound-no acute findings.  AKI likely secondary to ATN from concurrent illness/pneumonia/influenza A. ANCA ab pending Will follow up as outpatient in 1-2 weeks   2.  Acute respiratory failure,  hypoxic Influenza A positive Right lower lobe pneumonia-streptococcal pneumonia Currently getting treatment with Tamiflu, ceftriaxone and azithromycin. Management as per ICU team.    LOS: 4 John Peterson Thedore Mins 2/20/20251:37 PM  [facility] [place], [place] [phone]  Note: This note was prepared with Dragon dictation. Any transcription errors are unintentional

## 2023-03-30 NOTE — Evaluation (Signed)
Physical Therapy Evaluation Patient Details Name: John Peterson MRN: [id number] DOB: [birth year] Today's Date: 03/30/2023  History of Present Illness  Patient is a 78 year old male presenting with shortness of breath, found to have Flu A and briefly intubated. Right lower lobe pneumonia, acute renal failure. History of cutaneous T-cell lymphoma, hyperlipidemia, hypertension, seizure disorder, right total knee arthroplasty, peripheral vascular disease, GERD   Clinical Impression  Patient is agreeable to PT evaluation. He is typically independent and lives with spouse.  Today the patient required assistance for mobility. He was able to walk a short distance with rolling walker with steadying assistance. Activity tolerance limited by fatigue. Sp02 90% on 4 L02 with mobility. Recommend to continue PT to maximize independence and facilitate return to prior level of function.         If plan is discharge home, recommend the following: A little help with walking and/or transfers;A little help with bathing/dressing/bathroom;Assist for transportation;Help with stairs or ramp for entrance   Can travel by private vehicle        Equipment Recommendations Rolling walker (2 wheels)  Recommendations for Other Services       Functional Status Assessment Patient has had a recent decline in their functional status and demonstrates the ability to make significant improvements in function in a reasonable and predictable amount of time.     Precautions / Restrictions Precautions Precautions: Fall Restrictions Weight Bearing Restrictions Per Provider Order: No      Mobility  Bed Mobility Overal bed mobility: Needs Assistance Bed Mobility: Supine to Sit     Supine to sit: Min assist, HOB elevated          Transfers Overall transfer level: Needs assistance Equipment used: Rolling walker (2 wheels) Transfers: Sit to/from Stand Sit to Stand: Min assist, Mod assist, +2 physical assistance  (intermittent +2 assistance initially)           General transfer comment: cues for hand placement for safety    Ambulation/Gait Ambulation/Gait assistance: Min assist Gait Distance (Feet): 15 Feet Assistive device: Rolling walker (2 wheels) Gait Pattern/deviations: Step-to pattern, Trunk flexed, Narrow base of support Gait velocity: decreased     General Gait Details: cues for safety using rolling walker for support in standing  Stairs            Wheelchair Mobility     Tilt Bed    Modified Rankin (Stroke Patients Only)       Balance Overall balance assessment: Needs assistance Sitting-balance support: Feet supported Sitting balance-Leahy Scale: Good     Standing balance support: Bilateral upper extremity supported Standing balance-Leahy Scale: Fair Standing balance comment: using rolling walker for support                             Pertinent Vitals/Pain Pain Assessment Pain Assessment: No/denies pain    Home Living Family/patient expects to be discharged to:: Private residence Living Arrangements: Spouse/significant other Available Help at Discharge: Family Type of Home: Other(Comment) (townhouse) Home Access: Level entry       Home Layout: Able to live on main level with bedroom/bathroom Home Equipment: Agricultural consultant (2 wheels);Grab bars - tub/shower      Prior Function Prior Level of Function : Independent/Modified Independent             Mobility Comments: amb with no AD ADLs Comments: MOD I-I In ADL/IADL     Extremity/Trunk Assessment   Upper Extremity Assessment  Upper Extremity Assessment: Generalized weakness    Lower Extremity Assessment Lower Extremity Assessment: Generalized weakness    Cervical / Trunk Assessment Cervical / Trunk Assessment: Normal  Communication   Communication Communication: No apparent difficulties    Cognition Arousal: Alert Behavior During Therapy: Flat affect   PT -  Cognitive impairments: No family/caregiver present to determine baseline                       PT - Cognition Comments: increased time, some repetition required for single step command following Following commands: Impaired       Cueing Cueing Techniques: Verbal cues, Tactile cues, Visual cues     General Comments General comments (skin integrity, edema, etc.): pt on 4L via [place] throughout, spo2 90% while amb;    Exercises     Assessment/Plan    PT Assessment Patient needs continued PT services  PT Problem List Decreased strength;Decreased range of motion;Decreased activity tolerance;Decreased balance;Decreased mobility;Decreased cognition;Decreased safety awareness       PT Treatment Interventions DME instruction;Gait training;Functional mobility training;Therapeutic activities;Therapeutic exercise;Balance training;Neuromuscular re-education;Cognitive remediation;Patient/family education;Wheelchair mobility training    PT Goals (Current goals can be found in the Care Plan section)  Acute Rehab PT Goals Patient Stated Goal: to go home PT Goal Formulation: With patient/family Time For Goal Achievement: 04/13/23 Potential to Achieve Goals: Fair    Frequency Min 1X/week     Co-evaluation PT/OT/SLP Co-Evaluation/Treatment: Yes Reason for Co-Treatment: Complexity of the patient's impairments (multi-system involvement) PT goals addressed during session: Mobility/safety with mobility OT goals addressed during session: ADL's and self-care       AM-PAC PT "6 Clicks" Mobility  Outcome Measure Help needed turning from your back to your side while in a flat bed without using bedrails?: A Lot Help needed moving from lying on your back to sitting on the side of a flat bed without using bedrails?: A Little Help needed moving to and from a bed to a chair (including a wheelchair)?: A Little Help needed standing up from a chair using your arms (e.g., wheelchair or bedside  chair)?: A Little Help needed to walk in hospital room?: A Little Help needed climbing 3-5 steps with a railing? : A Lot 6 Click Score: 16    End of Session   Activity Tolerance: Patient limited by fatigue Patient left: in chair;with call bell/phone within reach;with family/visitor present Nurse Communication: Mobility status PT Visit Diagnosis: Muscle weakness (generalized) (M62.81);Unsteadiness on feet (R26.81)    Time: 8295-6213 PT Time Calculation (min) (ACUTE ONLY): 37 min   Charges:   PT Evaluation $PT Eval High Complexity: 1 High PT Treatments $Therapeutic Activity: 8-22 mins PT General Charges $$ ACUTE PT VISIT: 1 Visit         Donna Bernard, PT, MPT   Ina Homes 03/30/2023, 12:51 PM

## 2023-03-30 NOTE — Progress Notes (Addendum)
Progress Note   Patient: John Peterson ONG:[id number] DOB: 04/06/1945 DOA: 03/26/2023     4 DOS: the patient was seen and examined on 03/30/2023   Brief hospital course: HPI on admission to ICU 03/26/23:  "78 y.o male with significant PMH of CTCL (cutaneous T-cell cell lymphoma), hyperlipidemia, hypertension, seizure, right TKA, PVD, and GERD who presented to the ED with chief complaints of progressive shortness of breath.   Per ED reports and admit notes, patient recently came back from a cruise with his wife who tested positive for flu. Patient developed progressive shortness of breath, generalized malaise, cough and increased work of breathing this week. Symptoms worsened over the past 4 to 5 days prior to presenting to the ED"  Pt was admitted to the ICU for further evaluation and management of sepsis secondary to pneumonia and influenza A, initially requiring intubation and mechanical ventilation from 2/17 >> 03/28/23 when pt was successfully extubated to heated high flow oxygen.    TRH assumed care on 03/29/23.  Further hospital course and management as outlined below.  2/19 -- weaned 45 L/min HHF O2 >> 4 L/min [place] O2 today.  Stable for transfer to med/tele floor.   Assessment and Plan:  Acute respiratory failure with hypoxia and hypercapnia Influenza A Secondary to strep pneumoniae pneumonia Right pleural effusion Sepsis due to flu and pneumonia Patient almost like required intubation mechanical ventilation ICU, successfully extubated to heated high flow oxygen on 2/18. 2/19 -- Weaned from 45 L/min heated high flow to 4 L/min nasal cannula O2  2/20 -- On 4 L/min O2 this AM >> 3 L this afternoon with O2 sats 95-99% --Continue IV antibiotics -cefepime, Zithromax -- Scheduled and as needed neb treatments -- Repeat chest x-ray and blood gases as needed -- Continue Tamiflu -- Sepsis physiology improved, maintain MAP above 65 -- Monitor fever curve, CBC -- Follow cultures  AKI due  to ATN Anion gap metabolic acidosis --Nephrology is following -- I/O's, urinary output closely -- MAP above 65 for adequate renal perfusion -- Monitor electrolytes, renal parameters  Thrombocytopenia -- 2/20 noted platelets dropped 237 >> 108k -- Hold off Lovenox -- Avoid anti-platelet therapies -- Daily CBC's to monitor -- Suspect due to infection, but will check HIT Ab as well  Hypoglycemia due to poor p.o. intake related to acute illness and encephalopathy -- Monitor CBGs closely -- Hypoglycemia protocol -- Currently off D5W  Acute metabolic encephalopathy Due to acute illness.  Mental status improving. --Management of underlying conditions as outlined --Delirium precautions  Hypertension --Continue holding amlodipine with soft BP  Hyperlipidemia -- Continue Lipitor  Gout - stable -- Resume home allopurinol      Subjective: Patient up in recliner sleeping but woke easily to voice this AM.  Today he is able to tell me all the stops on their cruise, yesterday he was not able to do so.  He denies F/C, N/V, CP or SOB.  No acute complaints other than being tired.   Physical Exam: Vitals:   03/30/23 1200 03/30/23 1230 03/30/23 1430 03/30/23 1500  BP: 133/74 133/70 127/73 124/71  Pulse: 85 87 86 84  Resp: (!) 32 (!) 33 (!) 31 (!) 32  Temp:      TempSrc:      SpO2: 97% 95% 95% 99%  Weight:      Height:       General exam: awake, drowsy but more alert and interactive than yesterday, no acute distress HEENT: moist mucus membranes, hearing grossly normal  Respiratory system: lungs overall clear but mildly diminished, faint scattered rhonchi, normal respiratory effort at rest. On 4 L/in HF [place] O2 Cardiovascular system: normal S1/S2, RRR, L>R upper extremity edema, BLE edema  Gastrointestinal system: soft, NT, ND Central nervous system: A&O x 3. no gross focal neurologic deficits, normal speech Extremities: moves all , no edema, normal tone Skin: dry, intact, normal  temperature Psychiatry: normal mood, congruent affect    Data Reviewed:  Notable labs --   BUN 94 >> 83 improving Cr 2.87 >> 2.13 improving Ca 8.1  Mg 2.7 >> 3.0 Albumin 1.9  WBC 20.2 >> 13.3  CBG's: 75 >> 65 >> 79 >> 95     Family Communication: wife updated by phone 2/19. Will attempt to call again today as time allows.   Disposition: Status is: Inpatient Remains inpatient appropriate because: severity of illness as above, still weaning down oxygen, remains on IV therapies   Planned Discharge Destination: Home with Home Health    Time spent: 42 minutes  Author: Pennie Banter, DO 03/30/2023 4:40 PM  For on call review [email or web address].

## 2023-03-30 NOTE — Progress Notes (Signed)
Patient received from [place]. Transferred from the wheel chair to the bed with assist of 2 staff. O2 at 3 lpm via [place]. Oriented x 3. Skin intact with generalized edema. Seizure precautions in place. Patient eating dinner.  John Peterson

## 2023-03-31 ENCOUNTER — Inpatient Hospital Stay: Payer: Medicare PPO

## 2023-03-31 DIAGNOSIS — J9601 Acute respiratory failure with hypoxia: Secondary | ICD-10-CM | POA: Diagnosis not present

## 2023-03-31 LAB — CBC
HCT: 37.5 % — ABNORMAL LOW (ref 39.0–52.0)
Hemoglobin: 13 g/dL (ref 13.0–17.0)
MCH: 31.3 pg (ref 26.0–34.0)
MCHC: 34.7 g/dL (ref 30.0–36.0)
MCV: 90.4 fL (ref 80.0–100.0)
Platelets: 296 10*3/uL (ref 150–400)
RBC: 4.15 MIL/uL — ABNORMAL LOW (ref 4.22–5.81)
RDW: 14.9 % (ref 11.5–15.5)
WBC: 12.3 10*3/uL — ABNORMAL HIGH (ref 4.0–10.5)
nRBC: 0 % (ref 0.0–0.2)

## 2023-03-31 LAB — RENAL FUNCTION PANEL
Albumin: 1.8 g/dL — ABNORMAL LOW (ref 3.5–5.0)
Anion gap: 7 (ref 5–15)
BUN: 67 mg/dL — ABNORMAL HIGH (ref 8–23)
CO2: 23 mmol/L (ref 22–32)
Calcium: 7.9 mg/dL — ABNORMAL LOW (ref 8.9–10.3)
Chloride: 109 mmol/L (ref 98–111)
Creatinine, Ser: 1.44 mg/dL — ABNORMAL HIGH (ref 0.61–1.24)
GFR, Estimated: 50 mL/min — ABNORMAL LOW (ref >60)
Glucose, Bld: 83 mg/dL (ref 70–99)
Phosphorus: 3.1 mg/dL (ref 2.5–4.6)
Potassium: 4.4 mmol/L (ref 3.5–5.1)
Sodium: 139 mmol/L (ref 135–145)

## 2023-03-31 LAB — D-DIMER, QUANTITATIVE: D-Dimer, Quant: 7.25 ug{FEU}/mL — ABNORMAL HIGH (ref 0.00–0.50)

## 2023-03-31 LAB — LEGIONELLA PNEUMOPHILA SEROGP 1 UR AG: L. pneumophila Serogp 1 Ur Ag: NEGATIVE

## 2023-03-31 LAB — MAGNESIUM: Magnesium: 2.5 mg/dL — ABNORMAL HIGH (ref 1.7–2.4)

## 2023-03-31 LAB — GLUCOSE, CAPILLARY
Glucose-Capillary: 80 mg/dL (ref 70–99)
Glucose-Capillary: 72 mg/dL (ref 70–99)
Glucose-Capillary: 103 mg/dL — ABNORMAL HIGH (ref 70–99)
Glucose-Capillary: 91 mg/dL (ref 70–99)

## 2023-03-31 LAB — C3 COMPLEMENT: C3 Complement: 143 mg/dL (ref 82–167)

## 2023-03-31 LAB — C4 COMPLEMENT: Complement C4, Body Fluid: 34 mg/dL (ref 12–38)

## 2023-03-31 MED ORDER — TECHNETIUM TO 99M ALBUMIN AGGREGATED
4.3000 | Freq: Once | INTRAVENOUS | Status: AC | PRN
  Administered 2023-03-31: 4.3 via INTRAVENOUS

## 2023-03-31 NOTE — Progress Notes (Signed)
Progress Note   Patient: John Peterson ACZ:[id number] DOB: [birth year] DOA: 03/26/2023     5 DOS: the patient was seen and examined on 03/31/2023   Brief hospital course: HPI on admission to ICU 03/26/23:  "78 y.o male with significant PMH of CTCL (cutaneous T-cell cell lymphoma), hyperlipidemia, hypertension, seizure, right TKA, PVD, and GERD who presented to the ED with chief complaints of progressive shortness of breath.   Per ED reports and admit notes, patient recently came back from a cruise with his wife who tested positive for flu. Patient developed progressive shortness of breath, generalized malaise, cough and increased work of breathing this week. Symptoms worsened over the past 4 to 5 days prior to presenting to the ED"  Pt was admitted to the ICU for further evaluation and management of sepsis secondary to pneumonia and influenza A, initially requiring intubation and mechanical ventilation from 2/17 >> 03/28/23 when pt was successfully extubated to heated high flow oxygen.    TRH assumed care on 03/29/23.  Further hospital course and management as outlined below.  2/19 -- weaned 45 L/min HHF O2 >> 4 L/min  O2 today.  Stable for transfer to med/tele floor.   Assessment and Plan:  Acute respiratory failure with hypoxia and hypercapnia Influenza A Secondary to strep pneumoniae pneumonia Right pleural effusion Sepsis due to flu and pneumonia Patient almost like required intubation mechanical ventilation ICU, successfully extubated to heated high flow oxygen on 2/18. 2/19 -- Weaned from 45 L/min heated high flow to 4 L/min nasal cannula O2  2/20 -- On 4 L/min O2 this AM >> 3 L this afternoon with O2 sats 95-99% 2/21 -- Weaned to 2 L/min O2.  New onset hemoptysis overnight --Continue IV antibiotics - Cefepime, Zithromax -- Scheduled and as needed neb treatments -- Repeat chest x-ray and blood gases as needed -- Continue Tamiflu -- Sepsis physiology improved, maintain MAP  above 65 -- Monitor fever curve, CBC -- Follow cultures  Hemoptysis - new onset overnight 2/20>>21 Platelets were down at 108k yesterday but recovered today.  Only taken off Lovenox yesterday, so was on VTE prophylaxis since admission, but need to rule out PE. --D-dimer elevated --NM perfusion scan stat --U/S bilateral LE's and LEFT UE --Resume prophylactic dose Lovenox if DVT/PE is ruled out, otherwise still start IV heparin  AKI due to ATN Anion gap metabolic acidosis --Nephrology is following -- I/O's, urinary output closely -- MAP above 65 for adequate renal perfusion -- Monitor electrolytes, renal parameters  Thrombocytopenia -- transient & resolved. 2/20 noted platelets dropped 237 >> 108k 2/21 platelets normalized 296k -- Holding Lovenox - see above -- Avoid anti-platelet therapies -- Daily CBC's to monitor -- HIT Ab pending but platelets recovered quickly  Hypoglycemia due to poor p.o. intake related to acute illness and encephalopathy -- Monitor CBGs closely -- Hypoglycemia protocol -- Currently off D5W  Acute metabolic encephalopathy Due to acute illness.  Mental status improving. --Management of underlying conditions as outlined --Delirium precautions  Hypertension --Continue holding amlodipine with soft BP  Hyperlipidemia -- Continue Lipitor  Gout - stable -- Resume home allopurinol      Subjective: Patient started coughing up blood overnight and this AM.  He otherwise reports fatigue. Denies feeling short of breath or having chest pain at rest or with deep inspiration.   Physical Exam: Vitals:   03/30/23 2043 03/31/23 0126 03/31/23 0500 03/31/23 0733  BP: (!) 140/77 (!) 140/72  (!) 146/75  Pulse: 86 93  96  Resp:  16 (!) 22  12  Temp: 97.6 F (36.4 C) 98.3 F (36.8 C)  98.7 F (37.1 C)  TempSrc: Oral Oral  Oral  SpO2: 95% 93%  94%  Weight:   104.7 kg   Height:       General exam: awake, alert, more interactive and conversational, no acute  distress HEENT: moist mucus membranes, hearing grossly normal  Respiratory system: lungs clear but diminished, tachypnic, normal respiratory effort at rest. On 2 L/in HF [place] O2, gross hemoptysis in wall canister and suction tubing Cardiovascular system: normal S1/S2, RRR, L>R upper extremity edema, BLE edema also L>R Gastrointestinal system: soft, NT, ND Central nervous system: A&O x 3. no gross focal neurologic deficits, normal speech Extremities: moves all , no edema, normal tone Skin: dry, intact, normal temperature Psychiatry: normal mood, congruent affect    Data Reviewed:  Notable labs --   BUN 94 >> 83 >> 67 improving Cr 2.87 >> 2.13 >> 1.44 improving Ca 7.9  Mg 2.7 >> 3.0 >> 2.5 Albumin 1.8  WBC 20.2 >> 13.3 >> 12.3  CBG's: in 70's to 80's no hypoglycemic episodes     Family Communication: wife and sister-in-law updated at bedside, daughter later also updated in person at bedside.   Disposition: Status is: Inpatient Remains inpatient appropriate because: severity of illness as above, still weaning down oxygen, remains on IV therapies   Planned Discharge Destination: Home with Home Health    Time spent: 55 minutes including time at bedside and in coordination of care.   Author: Pennie Banter, DO 03/31/2023 12:38 PM  For on call review [email or web address].

## 2023-03-31 NOTE — Progress Notes (Signed)
PT Cancellation Note  Patient Details Name: John Peterson MRN: [id number] DOB: 26-May-1945   Cancelled Treatment:    Reason Eval/Treat Not Completed: Other (comment).  Chart reviewed.  Pt resting in bed upon PT arrival and blood noted in canister on wall via suction.  Nurse reports pt has been coughing up blood, is pending further testing, and MD aware; nurse recommending holding therapy at this time.  Will monitor pt's status and re-attempt PT treatment session at a later date/time.  Hendricks Limes, PT 03/31/23, 11:32 AM

## 2023-03-31 NOTE — Progress Notes (Addendum)
Cliffton Asters NP  sent results of u/s doppler of upper extremities.

## 2023-03-31 NOTE — Plan of Care (Signed)
  Problem: Education: Goal: Knowledge of General Education information will improve Description: Including pain rating scale, medication(s)/side effects and non-pharmacologic comfort measures Outcome: Progressing   Problem: Health Behavior/Discharge Planning: Goal: Ability to manage health-related needs will improve Outcome: Progressing   Problem: Clinical Measurements: Goal: Ability to maintain clinical measurements within normal limits will improve Outcome: Progressing Goal: Will remain free from infection Outcome: Progressing Goal: Diagnostic test results will improve Outcome: Progressing Goal: Respiratory complications will improve Outcome: Progressing Goal: Cardiovascular complication will be avoided Outcome: Progressing   Problem: Activity: Goal: Risk for activity intolerance will decrease Outcome: Progressing   Problem: Nutrition: Goal: Adequate nutrition will be maintained Outcome: Progressing   Problem: Coping: Goal: Level of anxiety will decrease Outcome: Progressing   Problem: Elimination: Goal: Will not experience complications related to bowel motility Outcome: Progressing Goal: Will not experience complications related to urinary retention Outcome: Progressing   Problem: Pain Managment: Goal: General experience of comfort will improve and/or be controlled Outcome: Progressing   Problem: Safety: Goal: Ability to remain free from injury will improve Outcome: Progressing   Problem: Skin Integrity: Goal: Risk for impaired skin integrity will decrease Outcome: Progressing   Problem: Activity: Goal: Ability to tolerate increased activity will improve Outcome: Progressing   Problem: Respiratory: Goal: Ability to maintain adequate ventilation will improve Outcome: Progressing Goal: Ability to maintain a clear airway will improve Outcome: Progressing   Problem: Clinical Measurements: Goal: Ability to maintain a body temperature in the normal range  will improve Outcome: Progressing   Problem: Education: Goal: Ability to describe self-care measures that may prevent or decrease complications (Diabetes Survival Skills Education) will improve Outcome: Progressing Goal: Individualized Educational Video(s) Outcome: Progressing   Problem: Coping: Goal: Ability to adjust to condition or change in health will improve Outcome: Progressing   Problem: Fluid Volume: Goal: Ability to maintain a balanced intake and output will improve Outcome: Progressing   Problem: Health Behavior/Discharge Planning: Goal: Ability to identify and utilize available resources and services will improve Outcome: Progressing Goal: Ability to manage health-related needs will improve Outcome: Progressing   Problem: Metabolic: Goal: Ability to maintain appropriate glucose levels will improve Outcome: Progressing   Problem: Nutritional: Goal: Maintenance of adequate nutrition will improve Outcome: Progressing Goal: Progress toward achieving an optimal weight will improve Outcome: Progressing   Problem: Skin Integrity: Goal: Risk for impaired skin integrity will decrease Outcome: Progressing

## 2023-03-31 NOTE — Progress Notes (Signed)
OT Cancellation Note  Patient Details Name: John Peterson MRN: [id number] DOB: March 22, 1945   Cancelled Treatment:    Reason Eval/Treat Not Completed: Medical issues which prohibited therapy. Per PT, blood noted in canister on wall via suction. Nurse reports pt has been coughing up blood, is pending further testing, and MD aware; nurse recommending holding therapy at this time. Will monitor pt's status and re-attempt OT treatment session at a later date/time.   Arman Filter., MPH, MS, OTR/L ascom [phone] 03/31/23, 11:37 AM

## 2023-03-31 NOTE — Plan of Care (Signed)

## 2023-03-31 NOTE — Progress Notes (Signed)
       CROSS COVER NOTE  NAME: ZEALAND BOYETT MRN: [id number] DOB : December 03, 1945 ATTENDING PHYSICIAN: Pennie Banter, DO    Date of Service   03/31/2023   HPI/Events of Note   UE ultrasound results reported from nurse IMPRESSION: 1. 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 2. Otherwise no evidence of acute deep venous thrombosis elsewhere within the right upper extremity.  Nurse also reports anticoagulation on hold due to hemoptysis    Interventions   Assessment/Plan: No IV, BP or venipuncture RUE        Donnie Mesa NP [facility] Cross Cover 7pm-7am - check amion for availability Pager [phone]

## 2023-04-01 DIAGNOSIS — J9601 Acute respiratory failure with hypoxia: Secondary | ICD-10-CM | POA: Diagnosis not present

## 2023-04-01 LAB — CBC
HCT: 37.3 % — ABNORMAL LOW (ref 39.0–52.0)
Hemoglobin: 12.6 g/dL — ABNORMAL LOW (ref 13.0–17.0)
MCH: 31.7 pg (ref 26.0–34.0)
MCHC: 33.8 g/dL (ref 30.0–36.0)
MCV: 93.7 fL (ref 80.0–100.0)
Platelets: 383 10*3/uL (ref 150–400)
RBC: 3.98 MIL/uL — ABNORMAL LOW (ref 4.22–5.81)
RDW: 14.8 % (ref 11.5–15.5)
WBC: 11.8 10*3/uL — ABNORMAL HIGH (ref 4.0–10.5)
nRBC: 0 % (ref 0.0–0.2)

## 2023-04-01 LAB — GLUCOSE, CAPILLARY
Glucose-Capillary: 85 mg/dL (ref 70–99)
Glucose-Capillary: 90 mg/dL (ref 70–99)
Glucose-Capillary: 182 mg/dL — ABNORMAL HIGH (ref 70–99)
Glucose-Capillary: 148 mg/dL — ABNORMAL HIGH (ref 70–99)

## 2023-04-01 LAB — BASIC METABOLIC PANEL
Anion gap: 8 (ref 5–15)
BUN: 52 mg/dL — ABNORMAL HIGH (ref 8–23)
CO2: 24 mmol/L (ref 22–32)
Calcium: 8.2 mg/dL — ABNORMAL LOW (ref 8.9–10.3)
Chloride: 112 mmol/L — ABNORMAL HIGH (ref 98–111)
Creatinine, Ser: 1.3 mg/dL — ABNORMAL HIGH (ref 0.61–1.24)
GFR, Estimated: 56 mL/min — ABNORMAL LOW (ref >60)
Glucose, Bld: 91 mg/dL (ref 70–99)
Potassium: 4.4 mmol/L (ref 3.5–5.1)
Sodium: 144 mmol/L (ref 135–145)

## 2023-04-01 LAB — HEPARIN INDUCED PLATELET AB (HIT ANTIBODY): Heparin Induced Plt Ab: 0.137 {OD_unit} (ref 0.000–0.400)

## 2023-04-01 LAB — MRSA NEXT GEN BY PCR, NASAL: MRSA by PCR Next Gen: NOT DETECTED

## 2023-04-01 MED ORDER — METHYLPREDNISOLONE SODIUM SUCC 40 MG IJ SOLR
40.0000 mg | Freq: Two times a day (BID) | INTRAMUSCULAR | Status: AC
  Administered 2023-04-01 – 2023-04-03 (×6): 40 mg via INTRAVENOUS
  Filled 2023-04-01 (×6): qty 1

## 2023-04-01 MED ORDER — IPRATROPIUM-ALBUTEROL 0.5-2.5 (3) MG/3ML IN SOLN
3.0000 mL | Freq: Two times a day (BID) | RESPIRATORY_TRACT | Status: DC
  Administered 2023-04-01 – 2023-04-03 (×4): 3 mL via RESPIRATORY_TRACT
  Filled 2023-04-01 (×4): qty 3

## 2023-04-01 MED ORDER — ENOXAPARIN SODIUM 60 MG/0.6ML IJ SOSY
0.5000 mg/kg | PREFILLED_SYRINGE | INTRAMUSCULAR | Status: DC
  Administered 2023-04-01 – 2023-04-04 (×4): 52.5 mg via SUBCUTANEOUS
  Filled 2023-04-01 (×4): qty 0.6

## 2023-04-01 MED ORDER — LORATADINE 10 MG PO TABS
10.0000 mg | ORAL_TABLET | Freq: Every day | ORAL | Status: DC
  Administered 2023-04-01 – 2023-04-05 (×5): 10 mg via ORAL
  Filled 2023-04-01 (×5): qty 1

## 2023-04-01 MED ORDER — HYDROCORTISONE ACETATE 25 MG RE SUPP
25.0000 mg | Freq: Two times a day (BID) | RECTAL | Status: DC
  Administered 2023-04-01 – 2023-04-02 (×3): 25 mg via RECTAL
  Filled 2023-04-01 (×4): qty 1

## 2023-04-01 MED ORDER — DOCUSATE SODIUM 100 MG PO CAPS: 100.0000 mg | ORAL_CAPSULE | Freq: Two times a day (BID) | ORAL | Status: DC | PRN

## 2023-04-01 MED ORDER — POLYETHYLENE GLYCOL 3350 17 G PO PACK: 17.0000 g | PACK | Freq: Every day | ORAL | Status: DC | PRN

## 2023-04-01 MED ORDER — AMLODIPINE BESYLATE 10 MG PO TABS
10.0000 mg | ORAL_TABLET | Freq: Every day | ORAL | Status: DC
  Administered 2023-04-01 – 2023-04-05 (×5): 10 mg via ORAL
  Filled 2023-04-01 (×5): qty 1

## 2023-04-01 MED ORDER — IPRATROPIUM-ALBUTEROL 0.5-2.5 (3) MG/3ML IN SOLN
RESPIRATORY_TRACT | Status: AC
  Filled 2023-04-01: qty 3

## 2023-04-01 MED ORDER — IPRATROPIUM-ALBUTEROL 0.5-2.5 (3) MG/3ML IN SOLN: 3.0000 mL | Freq: Three times a day (TID) | RESPIRATORY_TRACT | Status: DC

## 2023-04-01 MED ORDER — ENOXAPARIN SODIUM 40 MG/0.4ML IJ SOSY: 40.0000 mg | PREFILLED_SYRINGE | INTRAMUSCULAR | Status: DC

## 2023-04-01 MED ORDER — FAMOTIDINE 20 MG PO TABS
20.0000 mg | ORAL_TABLET | Freq: Every day | ORAL | Status: DC
  Administered 2023-04-01 – 2023-04-04 (×4): 20 mg via ORAL
  Filled 2023-04-01 (×4): qty 1

## 2023-04-01 MED ORDER — TAMSULOSIN HCL 0.4 MG PO CAPS
0.4000 mg | ORAL_CAPSULE | Freq: Every day | ORAL | Status: DC
  Administered 2023-04-01 – 2023-04-04 (×5): 0.4 mg via ORAL
  Filled 2023-04-01 (×5): qty 1

## 2023-04-01 NOTE — Plan of Care (Signed)

## 2023-04-01 NOTE — Progress Notes (Signed)
 PHARMACIST - PHYSICIAN COMMUNICATION  CONCERNING:  Enoxaparin (Lovenox) for DVT Prophylaxis    RECOMMENDATION: Patient was prescribed enoxaprin 40mg  q24 hours for VTE prophylaxis.   Filed Weights   03/30/23 0339 03/31/23 0500 04/01/23 0145  Weight: 101.8 kg (224 lb 6.9 oz) 104.7 kg (230 lb 13.2 oz) 104.1 kg (229 lb 8 oz)    Body mass index is 32.93 kg/m.  Estimated Creatinine Clearance: 56.6 mL/min (A) (by C-G formula based on SCr of 1.3 mg/dL (H)).   Based on [facility] policy patient is candidate for enoxaparin 0.5mg /kg TBW SQ every 24 hours based on BMI being >30.   DESCRIPTION: Pharmacy has adjusted enoxaparin dose per [facility] policy.  Patient is now receiving enoxaparin 52.5 mg every 24 hours    Merryl Hacker, PharmD Clinical Pharmacist  04/01/2023 2:11 PM

## 2023-04-01 NOTE — Progress Notes (Signed)
 Progress Note   Patient: John Peterson EAV:[id number] DOB: Sep 22, 1945 DOA: 03/26/2023     6 DOS: the patient was seen and examined on 04/01/2023   Brief hospital course: HPI on admission to ICU 03/26/23:  "78 y.o male with significant PMH of CTCL (cutaneous T-cell cell lymphoma), hyperlipidemia, hypertension, seizure, right TKA, PVD, and GERD who presented to the ED with chief complaints of progressive shortness of breath.   Per ED reports and admit notes, patient recently came back from a cruise with his wife who tested positive for flu. Patient developed progressive shortness of breath, generalized malaise, cough and increased work of breathing this week. Symptoms worsened over the past 4 to 5 days prior to presenting to the ED"  Pt was admitted to the ICU for further evaluation and management of sepsis secondary to pneumonia and influenza A, initially requiring intubation and mechanical ventilation from 2/17 >> 03/28/23 when pt was successfully extubated to heated high flow oxygen.    TRH assumed care on 03/29/23.  Further hospital course and management as outlined below.  2/19 -- weaned 45 L/min [facility] O2 >> 4 L/min [place] O2 today.  Stable for transfer to med/tele floor.   Assessment and Plan:  Acute respiratory failure with hypoxia and hypercapnia Influenza A Secondary to strep pneumoniae pneumonia Right pleural effusion Sepsis due to flu and pneumonia Necrotizing / cavitary pneumonia -- based on repeat CXR 2/21 for hemoptysis Patient almost like required intubation mechanical ventilation ICU, successfully extubated to heated high flow oxygen on 2/18. 2/19 -- Weaned from 45 L/min heated high flow to 4 L/min nasal cannula O2  2/20 -- On 4 L/min O2 this AM >> 3 L this afternoon with O2 sats 95-99% 2/21 -- Weaned to 2 L/min O2.  New onset hemoptysis overnight.  New hemoptysis.   2/22 -- case discussed with pulmonology re hemoptysis and cavitation -- all consistent with severe necrotizing  pneumonia, recommend steroids and bronchodilators -- Start IV steroids -- Scheduled Duonebs, PRN albuterol -- Completed IV antibiotics - Cefepime, Zithromax -- Completed Tamiflu -- Sepsis physiology improved, maintain MAP above 65 -- Monitor fever curve, CBC -- Follow cultures  Hemoptysis - new onset overnight 2/20>>21.  Due to necrotizing post-flu pneumonia. 2/21 - NM perfusion scan -- low probability for PE Cavitation seen on repeat CXR 2/21 within the area of consolidation - consistent with necrotizing infection.   Platelets were down at 108k transiently on 2/20 but recovered.   No DVT on U/S bilateral LE's  Hbg stable. -- Resume prophylactic dose Lovenox  -- Monitor closely & trend Hbg -- okay to resume PT/OT  AKI due to ATN Anion gap metabolic acidosis -- Nephrology is following -- I/O's, urinary output closely -- MAP above 65 for adequate renal perfusion -- Monitor electrolytes, renal parameters  Thrombocytopenia -- transient & resolved. 2/20 noted platelets dropped 237 >> 108k 2/21 platelets normalized 296k -- Resume Lovenox for VTE prophylaxis -- Daily CBC's to monitor -- HIT Ab pending  Hypoglycemia due to poor p.o. intake related to acute illness and encephalopathy. Required D5w fluids that are now discontinued. -- Monitor CBGs closely -- Hypoglycemia protocol  Acute metabolic encephalopathy Due to acute illness.  Mental status improving. -- Management of underlying conditions as outlined -- Delirium precautions  Hypertension -- Continue holding amlodipine with soft BP  Hyperlipidemia -- Continue Lipitor  Gout - stable -- Continue allopurinol      Subjective: Patient awake resting in bed when seen this AM.  He reports feeling  better.  Coughing up blood but he states has improved a lot since yesterday.  Denies other acute complaints other than wanting to get up out of bed moving around.   Physical Exam: Vitals:   03/31/23 2120 04/01/23 0145  04/01/23 0416 04/01/23 0732  BP:   (!) 175/89   Pulse:   100   Resp:   20   Temp:   (!) 97.5 F (36.4 C)   TempSrc:      SpO2: 92%  92% 94%  Weight:  104.1 kg    Height:       General exam: awake, alert, more interactive and conversational, no acute distress HEENT: moist mucus membranes, hearing grossly normal  Respiratory system: lungs overall clear, diminished bases, right-sided faint rhonchi, 2 L/min [place] O2, hemoptysis in suction canister Cardiovascular system: normal S1/S2, RRR, improving edema in upper & lower extremities Gastrointestinal system: soft, NT, ND Central nervous system: A&O x 3. no gross focal neurologic deficits, normal speech Skin: dry, intact, normal temperature Psychiatry: normal mood, congruent affect    Data Reviewed:  Notable labs --   Cl 112 BUN 94 >> 83 >> 67 >> 52 improving Cr 2.87 >> 2.13 >> 1.44 >> 1.30 improving Ca 8.2  WBC 20.2 >> 13.3 >> 12.3 >> 11.8 Hbg 12.6 stable  MRSA not detected  Imaging studies 2/22 --  -- NM perfusion scan -- non-diagnostic - low/intermediate probability for PE -- CXR -- improving but persistent RLL consolidation with central lucency (?cavitation), trace b/l pleural effusions -- U/S dopper B/L LE's -- no DVT in either lower extremity -- U/S doppler RUE --- non-occlusive mid cephalic vein thrombus at site of prior IV   Family Communication: wife updated at bedside   Disposition: Status is: Inpatient Remains inpatient appropriate because: severity of illness as above, still weaning down oxygen, remains on IV therapies, ongoing evaluation and close monitoring of hemoptysis   Planned Discharge Destination: Home with Home Health    Time spent: 45 minutes   Author: Pennie Banter, DO 04/01/2023 1:59 PM  For on call review [email or web address].

## 2023-04-01 NOTE — TOC Initial Note (Signed)
 Transition of Care [facility]) - Initial/Assessment Note    Patient Details  Name: John Peterson MRN: [id number] Date of Birth: 06-Apr-1945  Transition of Care [facility]) CM/SW Contact:    Rodney Langton, RN Phone Number: 04/01/2023, 3:40 PM  Clinical Narrative:                  Spoke with patient and wife, admitted from home with wife.  PCP is Dr. Letitia Libra, [facility]  Has rolling walker and lift chair in the home, wife state she is able to get [facility] from friends/family if needed.  She is advised to notify [facility] team if unable to obtain and it is needed.  Made aware of recommendations for home health PT and OT, family state they were also told he would be able to get an aide.  Does not have preference of agency, referral accepted by [place] with [facility].  Per wife, patient may need to go home with oxygen if unable to wean.  TOC will continue to follow.  Expected Discharge Plan: Home w Home Health Services Barriers to Discharge: Continued Medical Work up   Patient Goals and CMS Choice Patient states their goals for this hospitalization and ongoing recovery are:: Home          Expected Discharge Plan and Services     Post Acute Care Choice: Home Health Living arrangements for the past 2 months: Single Family Home                           HH Arranged: PT, OT, Nurse's Aide HH Agency: [facility] Date [facility] Agency Contacted: 04/01/23 Time HH Agency Contacted: [phone] Representative spoke with at [facility] Agency: Kandee Keen  Prior Living Arrangements/Services Living arrangements for the past 2 months: Single Family Home Lives with:: Spouse Patient language and need for interpreter reviewed:: Yes Do you feel safe going back to the place where you live?: Yes      Need for Family Participation in Patient Care: Yes (Comment) Care giver support system in place?: Yes (comment) Current home services: DME (RW and lift chair. Wife says has access to [facility] if needed) Criminal  Activity/Legal Involvement Pertinent to Current Situation/Hospitalization: No - Comment as needed  Activities of Daily Living   ADL Screening (condition at time of admission) Independently performs ADLs?: No Does the patient have a NEW difficulty with bathing/dressing/toileting/self-feeding that is expected to last >3 days?: No Does the patient have a NEW difficulty with getting in/out of bed, walking, or climbing stairs that is expected to last >3 days?: No Does the patient have a NEW difficulty with communication that is expected to last >3 days?: No Is the patient deaf or have difficulty hearing?: No Does the patient have difficulty seeing, even when wearing glasses/contacts?: No Does the patient have difficulty concentrating, remembering, or making decisions?: No  Permission Sought/Granted   Permission granted to share information with : Yes, Verbal Permission Granted              Emotional Assessment       Orientation: : Oriented to Self, Oriented to Place, Oriented to  Time, Oriented to Situation   Psych Involvement: No (comment)  Admission diagnosis:  Shortness of breath [R06.02] Pleural effusion [J90] Acute respiratory failure with hypoxia (HCC) [J96.01] AKI (acute kidney injury) (HCC) [N17.9] Influenza [J11.1] Patient Active Problem List   Diagnosis Date Noted   Acute respiratory failure with hypoxia (HCC) 03/26/2023  AKI (acute kidney injury) (HCC) 03/26/2023   Xiphoid prominence 07/01/2019   Swelling of limb 06/11/2019   Status post total knee replacement using cement, right 04/02/2019   PVD (peripheral vascular disease) (HCC) 02/15/2019   Obesity (BMI 30-39.9) 11/22/2016   Essential hypertension, benign 06/07/2016   Hyperlipidemia 06/07/2016   Carotid stenosis 06/07/2016   Asymptomatic hyperuricemia 07/27/2015   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   Primary osteoarthritis of right knee 01/14/2015   Presence of left artificial knee joint 03/10/2014    Gross hematuria 01/23/2013   PCP:  Gracelyn Nurse, MD Pharmacy:   [phone] [place] [place] - [address] [place] [address] Phone: [phone] Fax: [phone]  [id number] [place] [place] - [address] [place] [address] Phone: [phone] Fax: [phone]     Social Drivers of Health (SDOH) Social History: SDOH Screenings   Food Insecurity: Patient Unable To Answer (03/27/2023)  Housing: Patient Unable To Answer (03/27/2023)  Transportation Needs: Patient Unable To Answer (03/27/2023)  Utilities: Patient Unable To Answer (03/27/2023)  Social Connections: Unknown (03/27/2023)  Tobacco Use: Medium Risk (03/27/2023)   SDOH Interventions:     Readmission Risk Interventions     No data to display

## 2023-04-01 NOTE — Progress Notes (Signed)
 Occupational Therapy Treatment Patient Details Name: John Peterson MRN: [id number] DOB: Jun 15, 1945 Today's Date: 04/01/2023   History of present illness Pt is a 78 year old male admitted with Acute respiratory failure with hypoxia and hypercapnia  Influenza A,Secondary to strep pneumoniae pneumonia,Right pleural effusion  Sepsis due to flu and pneumonia, AKI due to ATN, hypoglycemia, Acute metabolic encephalopathy       PMH significant for CTCL (cutaneous T-cell cell lymphoma), hyperlipidemia, hypertension, seizure, right TKA, PVD, and GERD   OT comments  Pt is supine in bed on arrival. Pleasant and agreeable to OT session. He denies pain. Continues to cough up blood and have dark diarrhea. Pt performed bed mobility CGA with HOB elevated. Bumped from 2.5 to 4L d/t sp02 at 90% with little activity, HR up to 119. Took a seated RB once reached EOB prior to scooting forward with SBA and performed STS from EOB with it elevated at his request with Min/CGA to RW. Took a few small steps forward to sink and attempted to use urinal. Urinated in the floor then had BM incontinence in floor. Able to stand while peri-care provided with Max A. Stood 10-15 mins during session with sp02 ranging 87-90% on 4L. Pt fatigued at end of session and returned to supine with CGA and nurse notified of need for increased oxygen and BM and urination. He remains very weak and limited by his low activity tolerance and coughing spells of spitting up blood. Family present and he was left with all needs in place and will cont to require skilled acute OT services to maximize his safety and IND to return to PLOF.       If plan is discharge home, recommend the following:  A little help with walking and/or transfers;A little help with bathing/dressing/bathroom;Direct supervision/assist for medications management;Assistance with cooking/housework   Equipment Recommendations  BSC/3in1;Other (comment);Tub/shower seat (FWW)     Recommendations for Other Services      Precautions / Restrictions Precautions Precautions: Fall Restrictions Weight Bearing Restrictions Per Provider Order: No       Mobility Bed Mobility Overal bed mobility: Needs Assistance Bed Mobility: Supine to Sit, Sit to Supine     Supine to sit: Contact guard, Used rails, HOB elevated Sit to supine: Contact guard assist   General bed mobility comments: CGA for bed mobility with bed height elevated; no physical assist needed    Transfers Overall transfer level: Needs assistance Equipment used: Rolling walker (2 wheels) Transfers: Sit to/from Stand Sit to Stand: Contact guard assist, Min assist           General transfer comment: Min/CGA however bed height elevated at pt request to stand at RW x10-15 mins with ability to take 2 forward and backward steps using RW with CGA, attempted to use urinal and had BM requiring peri-care     Balance Overall balance assessment: Needs assistance   Sitting balance-Leahy Scale: Good     Standing balance support: Bilateral upper extremity supported, During functional activity, Reliant on assistive device for balance Standing balance-Leahy Scale: Fair Standing balance comment: CGA and RW use                           ADL either performed or assessed with clinical judgement   ADL Overall ADL's : Needs assistance/impaired  Toileting- Clothing Manipulation and Hygiene: Maximal assistance;Sit to/from stand Toileting - Clothing Manipulation Details (indicate cue type and reason): incontinence episode in the floor while standing at sink trying to use urinal also had BM            Extremity/Trunk Assessment              Vision       Perception     Praxis     Communication Communication Communication: No apparent difficulties   Cognition Arousal: Alert                                   Following commands:  Impaired Following commands impaired: Follows one step commands with increased time      Cueing   Cueing Techniques: Verbal cues, Tactile cues, Visual cues  Exercises Other Exercises Other Exercises: pt cont to cough up blood during session    Shoulder Instructions       General Comments 2.5L 02 at rest at 90% bumped to 4L and dropped to 87-90% throughout remainder of session and left on 4L; nurse notified of BM, urination and 4L    Pertinent Vitals/ Pain       Pain Assessment Pain Assessment: No/denies pain  Home Living                                          Prior Functioning/Environment              Frequency  Min 1X/week        Progress Toward Goals  OT Goals(current goals can now be found in the care plan section)  Progress towards OT goals: Progressing toward goals  Acute Rehab OT Goals Patient Stated Goal: return home OT Goal Formulation: With patient/family Time For Goal Achievement: 04/13/23 Potential to Achieve Goals: Good  Plan      Co-evaluation                 AM-PAC OT "6 Clicks" Daily Activity     Outcome Measure   Help from another person eating meals?: None Help from another person taking care of personal grooming?: None Help from another person toileting, which includes using toliet, bedpan, or urinal?: A Lot Help from another person bathing (including washing, rinsing, drying)?: A Lot Help from another person to put on and taking off regular upper body clothing?: A Little Help from another person to put on and taking off regular lower body clothing?: A Lot 6 Click Score: 17    End of Session Equipment Utilized During Treatment: Rolling walker (2 wheels);Oxygen  OT Visit Diagnosis: Other abnormalities of gait and mobility (R26.89);Muscle weakness (generalized) (M62.81);Unsteadiness on feet (R26.81)   Activity Tolerance Patient tolerated treatment well;Patient limited by fatigue   Patient Left with  family/visitor present;in bed;with bed alarm set   Nurse Communication Mobility status        Time: 4098-1191 OT Time Calculation (min): 39 min  Charges: OT General Charges $OT Visit: 1 Visit OT Treatments $Self Care/Home Management : 8-22 mins $Therapeutic Activity: 23-37 mins  Dublin Cantero, OTR/L  04/01/23, 3:59 PM   Constance Goltz 04/01/2023, 3:56 PM

## 2023-04-01 NOTE — Progress Notes (Signed)
 PT Cancellation Note  Patient Details Name: John Peterson MRN: [id number] DOB: 07-30-1945   Cancelled Treatment:     PT attempt. Author responded to pt call bell. Pt's family arrived after author did. Per family," he has not had anything to eat. We ate his breakfast cause he didn't want it." Author assisted pt with sitting up in bed and setting up lunch tray. Pt was slightly SOB just with HOB elevated. Per RN staff," pt was coughing up blood and having very loose stools with earlier attempt OOB. Acute PT will return tomorrow and continue to follow per current POC. Discussed with family at length DC disposition. Pt's family concerned that he will need STR now that he has had lack of nutrition and OOB activity in several days. I informed them we will see and update recs to STR if appropriate based of how pt is performing. Currently encouraged pt to increase intake.Acute PT will return at later time/date     Rushie Chestnut 04/01/2023, 2:33 PM

## 2023-04-02 DIAGNOSIS — J9601 Acute respiratory failure with hypoxia: Secondary | ICD-10-CM | POA: Diagnosis not present

## 2023-04-02 LAB — BASIC METABOLIC PANEL
Anion gap: 9 (ref 5–15)
BUN: 54 mg/dL — ABNORMAL HIGH (ref 8–23)
CO2: 22 mmol/L (ref 22–32)
Calcium: 8.1 mg/dL — ABNORMAL LOW (ref 8.9–10.3)
Chloride: 109 mmol/L (ref 98–111)
Creatinine, Ser: 1.22 mg/dL (ref 0.61–1.24)
GFR, Estimated: >60 mL/min (ref >60)
Glucose, Bld: 173 mg/dL — ABNORMAL HIGH (ref 70–99)
Potassium: 4.5 mmol/L (ref 3.5–5.1)
Sodium: 140 mmol/L (ref 135–145)

## 2023-04-02 LAB — GLUCOSE, CAPILLARY
Glucose-Capillary: 143 mg/dL — ABNORMAL HIGH (ref 70–99)
Glucose-Capillary: 144 mg/dL — ABNORMAL HIGH (ref 70–99)
Glucose-Capillary: 146 mg/dL — ABNORMAL HIGH (ref 70–99)
Glucose-Capillary: 156 mg/dL — ABNORMAL HIGH (ref 70–99)

## 2023-04-02 LAB — CULTURE, BLOOD (ROUTINE X 2)
Culture: NO GROWTH
Culture: NO GROWTH
Special Requests: ADEQUATE
Special Requests: ADEQUATE

## 2023-04-02 LAB — CBC
HCT: 34.9 % — ABNORMAL LOW (ref 39.0–52.0)
Hemoglobin: 11.7 g/dL — ABNORMAL LOW (ref 13.0–17.0)
MCH: 31.9 pg (ref 26.0–34.0)
MCHC: 33.5 g/dL (ref 30.0–36.0)
MCV: 95.1 fL (ref 80.0–100.0)
Platelets: 432 10*3/uL — ABNORMAL HIGH (ref 150–400)
RBC: 3.67 MIL/uL — ABNORMAL LOW (ref 4.22–5.81)
RDW: 14.7 % (ref 11.5–15.5)
WBC: 9.3 10*3/uL (ref 4.0–10.5)
nRBC: 0 % (ref 0.0–0.2)

## 2023-04-02 MED ORDER — HYDROCORTISONE 1 % EX CREA
TOPICAL_CREAM | Freq: Two times a day (BID) | CUTANEOUS | Status: DC
  Administered 2023-04-03: 1 via TOPICAL
  Filled 2023-04-02: qty 28

## 2023-04-02 MED ORDER — KETOCONAZOLE 2 % EX CREA
1.0000 | TOPICAL_CREAM | Freq: Every day | CUTANEOUS | Status: DC
  Administered 2023-04-02 – 2023-04-05 (×4): 1 via TOPICAL
  Filled 2023-04-02: qty 15

## 2023-04-02 MED ORDER — LOPERAMIDE HCL 2 MG PO CAPS: 2.0000 mg | ORAL_CAPSULE | ORAL | Status: DC | PRN

## 2023-04-02 NOTE — Progress Notes (Signed)
 Physical Therapy Treatment Patient Details Name: John Peterson MRN: [id number] DOB: 1945/08/17 Today's Date: 04/02/2023   History of Present Illness Pt is a 78 year old male admitted with Acute respiratory failure with hypoxia and hypercapnia  Influenza A,Secondary to strep pneumoniae pneumonia,Right pleural effusion  Sepsis due to flu and pneumonia, AKI due to ATN, hypoglycemia, Acute metabolic encephalopathy       PMH significant for CTCL (cutaneous T-cell cell lymphoma), hyperlipidemia, hypertension, seizure, right TKA, PVD, and GERD    PT Comments  Pt ready for session.  Is able to get in/out of bed with bed features and cga x 1.  Verbal cues for self assist to reposition.  He spends and extended time sitting EOB with emphasis on proper breathing.  Does stand x 1 to RW for small steps forward, backwards and sidesteps along EOB with cga/min a x 1.  Pt frequently coughing and on one occurrence while sitting, coughs and bright red blood splatters on floor.  Does not seem watered down with sputum or spit.  Manages secretions with suction on his own.  Sats 88-91% with mobility during session.  Limited by general fatigue and malaise.  Given continued trouble with mobility and activity tolerance, recommendations updated to <3 hours a day therapy at this time.      If plan is discharge home, recommend the following: A little help with walking and/or transfers;A little help with bathing/dressing/bathroom;Assist for transportation;Help with stairs or ramp for entrance   Can travel by private vehicle        Equipment Recommendations       Recommendations for Other Services       Precautions / Restrictions Precautions Precautions: Fall Restrictions Weight Bearing Restrictions Per Provider Order: No     Mobility  Bed Mobility Overal bed mobility: Needs Assistance Bed Mobility: Supine to Sit, Sit to Supine     Supine to sit: Used rails, HOB elevated, Contact guard Sit to supine: HOB  elevated, Used rails, Contact guard assist   General bed mobility comments: cues for mobilty Patient Response: Cooperative  Transfers Overall transfer level: Needs assistance Equipment used: Rolling walker (2 wheels) Transfers: Sit to/from Stand Sit to Stand: Contact guard assist, Min assist                Ambulation/Gait   Gait Distance (Feet): 4 Feet Assistive device: Rolling walker (2 wheels) Gait Pattern/deviations: Step-through pattern, Decreased step length - right, Decreased step length - left Gait velocity: decreased     General Gait Details: able to take several small steps forward and back limited by fatiuge and coughing   Stairs             Wheelchair Mobility     Tilt Bed Tilt Bed Patient Response: Cooperative  Modified Rankin (Stroke Patients Only)       Balance Overall balance assessment: Needs assistance Sitting-balance support: Feet supported Sitting balance-Leahy Scale: Good     Standing balance support: Bilateral upper extremity supported, During functional activity, Reliant on assistive device for balance Standing balance-Leahy Scale: Fair Standing balance comment: CGA and RW use                            Communication    Cognition Arousal: Alert Behavior During Therapy: WFL for tasks assessed/performed   PT - Cognitive impairments: No apparent impairments  Cueing    Exercises Other Exercises Other Exercises: discussed HEP program    General Comments        Pertinent Vitals/Pain Pain Assessment Pain Assessment: No/denies pain    Home Living                          Prior Function            PT Goals (current goals can now be found in the care plan section) Progress towards PT goals: Progressing toward goals    Frequency    Min 1X/week      PT Plan      Co-evaluation              AM-PAC PT "6 Clicks" Mobility   Outcome  Measure  Help needed turning from your back to your side while in a flat bed without using bedrails?: A Little Help needed moving from lying on your back to sitting on the side of a flat bed without using bedrails?: A Little Help needed moving to and from a bed to a chair (including a wheelchair)?: A Little Help needed standing up from a chair using your arms (e.g., wheelchair or bedside chair)?: A Little Help needed to walk in hospital room?: A Little Help needed climbing 3-5 steps with a railing? : A Lot 6 Click Score: 17    End of Session Equipment Utilized During Treatment: Gait belt;Oxygen Activity Tolerance: Patient tolerated treatment well;Treatment limited secondary to medical complications (Comment) Patient left: in bed;with call bell/phone within reach;with bed alarm set;with family/visitor present Nurse Communication: Mobility status PT Visit Diagnosis: Muscle weakness (generalized) (M62.81);Unsteadiness on feet (R26.81)     Time: 0981-1914 PT Time Calculation (min) (ACUTE ONLY): 14 min  Charges:    $Therapeutic Activity: 8-22 mins PT General Charges $$ ACUTE PT VISIT: 1 Visit                     Danielle Dess, PTA 04/02/23, 12:46 PM

## 2023-04-02 NOTE — TOC Progression Note (Signed)
 Transition of Care [facility]) - Progression Note    Patient Details  Name: John Peterson MRN: [id number] Date of Birth: [birth year]  Transition of [facility]) CM/SW Contact  Rodney Langton, RN Phone Number: 04/02/2023, 3:27 PM  Clinical Narrative:     New recommendations for SNF, spoke with wife, does not have a preference at this time, but will review list on [email or web address]  Link for website emailed, she and daughter will consider, advised that search has been started to all facilities within patient's home zip code.  FL2 completed.   Expected Discharge Plan: Home w Home Health Services Barriers to Discharge: Continued Medical Work up  Expected Discharge Plan and Services     Post Acute Care Choice: Home Health Living arrangements for the past 2 months: Single Family Home                           HH Arranged: PT, OT, Nurse's Aide HH Agency: [facility] Date [facility] Agency Contacted: 04/01/23 Time HH Agency Contacted: 1528 Representative spoke with at [facility] Agency: Kandee Keen   Social Determinants of Health (SDOH) Interventions SDOH Screenings   Food Insecurity: Patient Unable To Answer (03/27/2023)  Housing: Patient Unable To Answer (03/27/2023)  Transportation Needs: Patient Unable To Answer (03/27/2023)  Utilities: Patient Unable To Answer (03/27/2023)  Social Connections: Unknown (03/27/2023)  Tobacco Use: Medium Risk (03/27/2023)    Readmission Risk Interventions     No data to display

## 2023-04-02 NOTE — [place] FL2 (Signed)
 [place] MEDICAID FL2 LEVEL OF CARE FORM     IDENTIFICATION  Patient Name: John Peterson Birthdate: [birth year] Sex: male Admission Date (Current Location): 03/26/2023  [facility] and [place] Number:  Chiropodist and Address:         Provider Number: [id number]  Attending Physician Name and Address:  Pennie Banter, DO  Relative Name and Phone Number:  Yeshua, Stryker (Spouse)  [phone]    Current Level of Care: SNF Recommended Level of Care: Skilled Nursing Facility Prior Approval Number:    Date Approved/Denied:   PASRR Number: [id number]  Discharge Plan: SNF    Current Diagnoses: Patient Active Problem List   Diagnosis Date Noted   Acute respiratory failure with hypoxia (HCC) 03/26/2023   AKI (acute kidney injury) (HCC) 03/26/2023   Xiphoid prominence 07/01/2019   Swelling of limb 06/11/2019   Status post total knee replacement using cement, right 04/02/2019   PVD (peripheral vascular disease) (HCC) 02/15/2019   Obesity (BMI 30-39.9) 11/22/2016   Essential hypertension, benign 06/07/2016   Hyperlipidemia 06/07/2016   Carotid stenosis 06/07/2016   Asymptomatic hyperuricemia 07/27/2015   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   Primary osteoarthritis of right knee 01/14/2015   Presence of left artificial knee joint 03/10/2014   Gross hematuria 01/23/2013    Orientation RESPIRATION BLADDER Height & Weight     Self, Time, Situation, Place  Normal   Weight: 104.2 kg Height:  5\' 10"  (177.8 cm)  BEHAVIORAL SYMPTOMS/MOOD NEUROLOGICAL BOWEL NUTRITION STATUS      Incontinent Diet  AMBULATORY STATUS COMMUNICATION OF NEEDS Skin   Limited Assist Verbally Normal                       Personal Care Assistance Level of Assistance  Bathing, Dressing Bathing Assistance: Limited assistance   Dressing Assistance: Limited assistance     Functional Limitations Info             SPECIAL CARE FACTORS FREQUENCY  PT (By licensed PT), OT  (By licensed OT)     PT Frequency: 5 time a week OT Frequency: 5 times a week            Contractures Contractures Info: Not present    Additional Factors Info  Allergies, Code Status Code Status Info: Full Allergies Info: Tramadol           Current Medications (04/02/2023):  This is the current hospital active medication list Current Facility-Administered Medications  Medication Dose Route Frequency Provider Last Rate Last Admin   acetaminophen (TYLENOL) tablet 650 mg  650 mg Oral Q4H PRN Effie Shy, RPH   650 mg at 04/01/23 0902   allopurinol (ZYLOPRIM) tablet 100 mg  100 mg Oral Daily Esaw Grandchild A, DO   100 mg at 04/02/23 4132   amLODipine (NORVASC) tablet 10 mg  10 mg Oral Daily Esaw Grandchild A, DO   10 mg at 04/02/23 4401   budesonide (PULMICORT) nebulizer solution 0.25 mg  0.25 mg Nebulization BID Erin Fulling, MD   0.25 mg at 04/02/23 0272   Chlorhexidine Gluconate Cloth 2 % PADS 6 each  6 each Topical Daily Erin Fulling, MD   6 each at 04/01/23 0909   docusate sodium (COLACE) capsule 100 mg  100 mg Oral BID PRN Esaw Grandchild A, DO       enoxaparin (LOVENOX) injection 52.5 mg  0.5 mg/kg Subcutaneous Q24H Paulita Fujita H, RPH   52.5 mg at 04/01/23 2044  famotidine (PEPCID) tablet 20 mg  20 mg Oral QHS Esaw Grandchild A, DO   20 mg at 04/01/23 2043   feeding supplement (ENSURE ENLIVE / ENSURE PLUS) liquid 237 mL  237 mL Oral TID BM Esaw Grandchild A, DO   237 mL at 04/02/23 1400   folic acid (FOLVITE) tablet 1 mg  1 mg Oral Daily Effie Shy, RPH   1 mg at 04/02/23 0981   hydrocortisone (ANUSOL-HC) suppository 25 mg  25 mg Rectal BID Esaw Grandchild A, DO   25 mg at 04/02/23 1914   hydrocortisone cream 1 %   Topical BID Pennie Banter, DO   Given at 04/02/23 7829   ipratropium-albuterol (DUONEB) 0.5-2.5 (3) MG/3ML nebulizer solution 3 mL  3 mL Nebulization BID Esaw Grandchild A, DO   3 mL at 04/02/23 5621   ketoconazole (NIZORAL) 2 % cream 1 Application   1 Application Topical Daily Pennie Banter, DO   1 Application at 04/02/23 3086   loperamide (IMODIUM) capsule 2 mg  2 mg Oral PRN Pennie Banter, DO       loratadine (CLARITIN) tablet 10 mg  10 mg Oral Daily Esaw Grandchild A, DO   10 mg at 04/02/23 5784   methylPREDNISolone sodium succinate (SOLU-MEDROL) 40 mg/mL injection 40 mg  40 mg Intravenous Q12H Esaw Grandchild A, DO   40 mg at 04/02/23 6962   multivitamin with minerals tablet 1 tablet  1 tablet Oral Daily Erin Fulling, MD   1 tablet at 04/02/23 0923   ondansetron (ZOFRAN) tablet 4 mg  4 mg Oral Q6H PRN Floydene Flock, MD       Or   ondansetron [facility]) injection 4 mg  4 mg Intravenous Q6H PRN Floydene Flock, MD       Oral care mouth rinse  15 mL Mouth Rinse PRN Esaw Grandchild A, DO       pantoprazole (PROTONIX) EC tablet 40 mg  40 mg Oral Q2000 Effie Shy, RPH   40 mg at 04/01/23 2044   polyethylene glycol (MIRALAX / GLYCOLAX) packet 17 g  17 g Oral Daily PRN Esaw Grandchild A, DO       revefenacin (YUPELRI) nebulizer solution 175 mcg  175 mcg Nebulization Daily Erin Fulling, MD   175 mcg at 04/02/23 0854   tamsulosin (FLOMAX) capsule 0.4 mg  0.4 mg Oral QPC supper Esaw Grandchild A, DO   0.4 mg at 04/01/23 1722   thiamine (VITAMIN B1) tablet 100 mg  100 mg Oral Daily Effie Shy, RPH   100 mg at 04/02/23 9528     Discharge Medications: Please see discharge summary for a list of discharge medications.  Relevant Imaging Results:  Relevant Lab Results:   Additional Information SSN=[id number]  Rodney Langton, RN

## 2023-04-02 NOTE — Plan of Care (Signed)

## 2023-04-02 NOTE — Progress Notes (Addendum)
 Progress Note   Patient: John Peterson [id number]:[id number] DOB: 05/21/1945 DOA: 03/26/2023     7 DOS: the patient was seen and examined on 04/02/2023   Brief hospital course: HPI on admission to ICU 03/26/23:  "78 y.o male with significant PMH of CTCL (cutaneous T-cell cell lymphoma), hyperlipidemia, hypertension, seizure, right TKA, PVD, and GERD who presented to the ED with chief complaints of progressive shortness of breath.   Per ED reports and admit notes, patient recently came back from a cruise with his wife who tested positive for flu. Patient developed progressive shortness of breath, generalized malaise, cough and increased work of breathing this week. Symptoms worsened over the past 4 to 5 days prior to presenting to the ED"  Pt was admitted to the ICU for further evaluation and management of sepsis secondary to pneumonia and influenza A, initially requiring intubation and mechanical ventilation from 2/17 >> 03/28/23 when pt was successfully extubated to heated high flow oxygen.    TRH assumed care on 03/29/23.  Further hospital course and management as outlined below.  2/19 -- weaned 45 L/min HHF O2 >> 4 L/min  O2 today.  Stable for transfer to med/tele floor.   Assessment and Plan:  Acute respiratory failure with hypoxia and hypercapnia Influenza A Secondary to strep pneumoniae pneumonia Right pleural effusion Sepsis due to flu and pneumonia Necrotizing / cavitary pneumonia -- based on repeat CXR 2/21 for hemoptysis Patient almost like required intubation mechanical ventilation ICU, successfully extubated to heated high flow oxygen on 2/18. 2/19 -- Weaned from 45 L/min heated high flow to 4 L/min nasal cannula O2  2/20 -- On 4 L/min O2 this AM >> 3 L this afternoon with O2 sats 95-99% 2/21 -- Weaned to 2 L/min O2.  New onset hemoptysis overnight.  New hemoptysis.   2/22 -- case discussed with pulmonology re hemoptysis and cavitation -- all consistent with severe necrotizing  pneumonia, recommend steroids and bronchodilators -- Continue IV steroids -- Scheduled Duonebs, PRN albuterol -- Completed IV antibiotics - Cefepime, Zithromax -- Completed Tamiflu -- Sepsis physiology improved, maintain MAP above 65 -- Monitor fever curve, CBC -- Follow cultures  Hemoptysis - new onset overnight 2/20>>21.  Due to necrotizing post-flu pneumonia. 2/21 - NM perfusion scan -- low probability for PE Cavitation seen on repeat CXR 2/21 within the area of consolidation - consistent with necrotizing infection.   Platelets were down at 108k transiently on 2/20 but recovered.   No DVT on U/S bilateral LE's  Hbg 12.6 >> 11.7 today, stable. -- Resume prophylactic dose Lovenox  -- Monitor closely & trend Hbg -- okay to resume PT/OT  AKI due to ATN Anion gap metabolic acidosis AKI now resolved, Cr 1.22 with GFR > 60 (2/23) -- Nephrology is following -- I/O's, urinary output closely -- MAP above 65 for adequate renal perfusion -- Monitor electrolytes, renal parameters  Acute Urinary Retention Pt has required multiple in/out caths, but hopes to avoid Foley --Home Flomax resumed  --Bladder scans PRN --In/out if retaining > 500 cc and unable to void --Will get Urology's input per pt's request   Thrombocytopenia -- transient & resolved. 2/20 noted platelets dropped 237 >> 108k 2/21 platelets normalized 296k -- Resumed Lovenox for VTE prophylaxis -- Daily CBC's to monitor -- HIT Ab pending  Hypoglycemia due to poor p.o. intake related to acute illness and encephalopathy. Required D5w fluids that are now discontinued. -- Monitor CBGs closely -- Hypoglycemia protocol  Acute metabolic encephalopathy Due to acute illness.  Mental status improving. -- Management of underlying conditions as outlined -- Delirium precautions  Hypertension -- Continue holding amlodipine with soft BP  Hyperlipidemia -- Continue Lipitor  Gout - stable -- Continue  allopurinol  Generalized weakness / Deconditioning Due to severe acute illness --PT/OT following --HH currently recommended --May require SNF/rehab  --Fall precautions      Subjective: Patient seen with family at bedside this AM.  He required in/out cath again last night & hopes to avoid need for catheter again.  Asks about seeing Dr. Lonna Cobb.  He denies shortness of breath. Still having some hemoptysis but it's slowed down a lot.  He is eager to work with therapy, feels quite weak.    Physical Exam: Vitals:   04/02/23 0344 04/02/23 0428 04/02/23 0700 04/02/23 0855  BP: (!) 144/84  (!) 143/75   Pulse: 85  93   Resp: 20     Temp: 98.7 F (37.1 C)  97.9 F (36.6 C)   TempSrc: Oral  Oral   SpO2: 95%  93% 92%  Weight:  104.2 kg    Height:       General exam: awake, alert, more interactive and conversational, no acute distress HEENT: moist mucus membranes, hearing grossly normal  Respiratory system: minimal right-sided crackles, otherwise clear lungs, no wheezes, right-sided faint rhonchi, 2.5 L/min [place] O2 Cardiovascular system: normal S1/S2, RRR, improving edema in upper & lower extremities Gastrointestinal system: soft, NT, ND Central nervous system: A&O x 3. no gross focal neurologic deficits, normal speech Skin: dry, intact, normal temperature Psychiatry: normal mood, congruent affect    Data Reviewed:  Notable labs --   CBG's at goal in 140's  BUN 94 >> 83 >> 67 >> 52 >> 54 improving Cr 2.87 >> 2.13 >> 1.44 >> 1.30 >> 1.22 improving Ca 8.1  WBC 20.2 >> 13.3 >> 12.3 >> 11.8 Hbg 12.6 >> 11.7 (still some hemoptysis)  MRSA not detected  Imaging studies 2/22 --  -- NM perfusion scan -- non-diagnostic - low/intermediate probability for PE -- CXR -- improving but persistent RLL consolidation with central lucency (?cavitation), trace b/l pleural effusions -- U/S dopper B/L LE's -- no DVT in either lower extremity -- U/S doppler RUE --- non-occlusive mid cephalic  vein thrombus at site of prior IV   Family Communication: family updated at bedside on rounds this AM   Disposition: Status is: Inpatient Remains inpatient appropriate because: severity of illness as above, still weaning down oxygen, remains on IV therapies, ongoing evaluation and close monitoring of hemoptysis   Planned Discharge Destination: Home with Home Health    Time spent: 42 minutes   Author: Pennie Banter, DO 04/02/2023 12:52 PM  For on call review [email or web address].

## 2023-04-02 NOTE — Plan of Care (Signed)
   Problem: Education: Goal: Knowledge of General Education information will improve Description: Including pain rating scale, medication(s)/side effects and non-pharmacologic comfort measures Outcome: Progressing   Problem: Clinical Measurements: Goal: Ability to maintain clinical measurements within normal limits will improve Outcome: Progressing

## 2023-04-03 DIAGNOSIS — J9601 Acute respiratory failure with hypoxia: Secondary | ICD-10-CM | POA: Diagnosis not present

## 2023-04-03 LAB — BASIC METABOLIC PANEL
Anion gap: 10 (ref 5–15)
BUN: 49 mg/dL — ABNORMAL HIGH (ref 8–23)
CO2: 23 mmol/L (ref 22–32)
Calcium: 8.3 mg/dL — ABNORMAL LOW (ref 8.9–10.3)
Chloride: 108 mmol/L (ref 98–111)
Creatinine, Ser: 1.04 mg/dL (ref 0.61–1.24)
GFR, Estimated: >60 mL/min (ref >60)
Glucose, Bld: 116 mg/dL — ABNORMAL HIGH (ref 70–99)
Potassium: 4.7 mmol/L (ref 3.5–5.1)
Sodium: 141 mmol/L (ref 135–145)

## 2023-04-03 LAB — CBC
HCT: 34 % — ABNORMAL LOW (ref 39.0–52.0)
Hemoglobin: 11.3 g/dL — ABNORMAL LOW (ref 13.0–17.0)
MCH: 31.5 pg (ref 26.0–34.0)
MCHC: 33.2 g/dL (ref 30.0–36.0)
MCV: 94.7 fL (ref 80.0–100.0)
Platelets: 459 10*3/uL — ABNORMAL HIGH (ref 150–400)
RBC: 3.59 MIL/uL — ABNORMAL LOW (ref 4.22–5.81)
RDW: 14.6 % (ref 11.5–15.5)
WBC: 10.7 10*3/uL — ABNORMAL HIGH (ref 4.0–10.5)
nRBC: 0 % (ref 0.0–0.2)

## 2023-04-03 LAB — ANCA PROFILE
Anti-MPO Antibodies: <0.2 U (ref 0.0–0.9)
Anti-PR3 Antibodies: <0.2 U (ref 0.0–0.9)
Atypical P-ANCA titer: <1:20 {titer}
C-ANCA: <1:20 {titer}
P-ANCA: <1:20 {titer}

## 2023-04-03 LAB — GLUCOSE, CAPILLARY: Glucose-Capillary: 136 mg/dL — ABNORMAL HIGH (ref 70–99)

## 2023-04-03 MED ORDER — HYDROCORTISONE (PERIANAL) 2.5 % EX CREA
TOPICAL_CREAM | Freq: Two times a day (BID) | CUTANEOUS | Status: DC
  Administered 2023-04-03: 1 via RECTAL
  Filled 2023-04-03: qty 28.35

## 2023-04-03 MED ORDER — PREDNISONE 20 MG PO TABS
40.0000 mg | ORAL_TABLET | Freq: Every day | ORAL | Status: DC
  Administered 2023-04-04 – 2023-04-05 (×2): 40 mg via ORAL
  Filled 2023-04-03 (×2): qty 2

## 2023-04-03 NOTE — Care Management Important Message (Signed)
 Important Message  Patient Details  Name: John Peterson MRN: [id number] Date of Birth: [birth year]   Important Message Given:  Yes - Medicare IM     Cristela Blue, CMA 04/03/2023, 11:41 AM

## 2023-04-03 NOTE — Progress Notes (Addendum)
 Progress Note   Patient: John Peterson [id number] DOB: 1945/10/10 DOA: 03/26/2023     8 DOS: the patient was seen and examined on 04/03/2023   Brief hospital course: HPI on admission to ICU 03/26/23:  "78 y.o male with significant PMH of CTCL (cutaneous T-cell cell lymphoma), hyperlipidemia, hypertension, seizure, right TKA, PVD, and GERD who presented to the ED with chief complaints of progressive shortness of breath.   Per ED reports and admit notes, patient recently came back from a cruise with his wife who tested positive for flu. Patient developed progressive shortness of breath, generalized malaise, cough and increased work of breathing this week. Symptoms worsened over the past 4 to 5 days prior to presenting to the ED"  Pt was admitted to the ICU for further evaluation and management of sepsis secondary to pneumonia and influenza A, initially requiring intubation and mechanical ventilation from 2/17 >> 03/28/23 when pt was successfully extubated to heated high flow oxygen.    TRH assumed care on 03/29/23.  Further hospital course and management as outlined below.   Assessment and Plan:  Acute respiratory failure with hypoxia and hypercapnia Influenza A Secondary to strep pneumoniae pneumonia Right pleural effusion Sepsis due to flu and pneumonia Necrotizing / cavitary pneumonia -- based on repeat CXR 2/21 for hemoptysis Patient almost like required intubation mechanical ventilation ICU, successfully extubated to heated high flow oxygen on 2/18. 2/19 -- Weaned from 45 L/min heated high flow to 4 L/min nasal cannula O2  2/20 -- On 4 L/min O2 this AM >> 3 L this afternoon with O2 sats 95-99% 2/21 -- Weaned to 2 L/min O2.  New onset hemoptysis overnight.  New hemoptysis.   2/22 -- case discussed with pulmonology re hemoptysis and cavitation -- all consistent with severe necrotizing pneumonia, recommend steroids and bronchodilators 2/24 -- weaned to 1 L.om O2, sat 93-95%,  hemoptysis improved but ongoing -- Continue IV steroids -- Solu-medrol 40 mg BID -- Transition to PO prednisone 40 mg daily tomorrow AM -- Scheduled Duonebs, PRN albuterol -- Completed IV antibiotics - Cefepime, Zithromax -- Completed Tamiflu -- Sepsis physiology improved, maintain MAP above 65 -- Monitor fever curve, CBC -- Follow cultures  Hemoptysis - new onset overnight 2/20>>21.  Due to necrotizing post-flu pneumonia. 2/21 - NM perfusion scan -- low probability for PE Cavitation seen on repeat CXR 2/21 within the area of consolidation - consistent with necrotizing infection.   Platelets were down at 108k transiently on 2/20 but recovered.   No DVT on U/S bilateral LE's  Hbg 12.6 >> 11.7 >> 11.3 today, stable. 2/24 -- still some hemoptysis but significantly improved -- Request to RN to empty suction canister to determine extent ongoing -- Resumed prophylactic dose Lovenox  -- Monitor closely & trend Hbg -- okay to work with PT/OT  AKI due to ATN Anion gap metabolic acidosis AKI now resolved, Cr 1.22 with GFR > 60 (2/23) -- Nephrology no longer following -- I/O's, urinary output closely -- MAP above 65 for adequate renal perfusion -- Monitor electrolytes, renal parameters  Acute Urinary Retention Pt has required multiple in/out caths, but hopes to avoid Foley --Home Flomax resumed  --Bladder scans PRN --In/out if retaining > 500 cc and unable to void --Will get Urology's input per pt's request   Thrombocytopenia -- transient & resolved. -- Resumed Lovenox for VTE prophylaxis -- Daily CBC's to monitor -- HIT Ab pending  Hypoglycemia due to poor p.o. intake related to acute illness and encephalopathy. Required D5w  fluids that are now discontinued. -- Monitor CBGs closely -- Hypoglycemia protocol  Acute metabolic encephalopathy Due to acute illness.  Mental status improving. -- Management of underlying conditions as outlined -- Delirium  precautions  Hypertension -- Continue holding amlodipine with soft BP  Hyperlipidemia -- Continue Lipitor  Gout - stable -- Continue allopurinol  Generalized weakness / Deconditioning Due to severe acute illness --PT/OT following --SNF recommended --Fall precautions      Subjective: Patient seen up in recliner this AM.  He reports "a mess" overnight after anusol suppository.  He states he mostly made it into the bathroom.  Still cough up some blood but has improved.  Denies shortness of breath at rest, but SOB present with exertion, states breathing improves after a few minutes once he sits.  No other acute complaints.    Physical Exam: Vitals:   04/02/23 1948 04/03/23 0328 04/03/23 0333 04/03/23 0735  BP: 134/63 (!) 158/80    Pulse: 95 79    Resp: 20 20    Temp: 98.3 F (36.8 C) 98.2 F (36.8 C)    TempSrc: Oral Oral    SpO2: 94% 95%  93%  Weight:   105.3 kg   Height:       General exam: awake, alert, more interactive and conversational, no acute distress HEENT: moist mucus membranes, hearing grossly normal  Respiratory system: minimal right-sided crackles, otherwise clear lungs, no wheezes, right-sided faint rhonchi, 2.5 L/min [place] O2 Cardiovascular system: normal S1/S2, RRR, improving edema in upper & lower extremities Gastrointestinal system: soft, NT, ND Central nervous system: A&O x 3. no gross focal neurologic deficits, normal speech Skin: dry, intact, normal temperature Psychiatry: normal mood, congruent affect    Data Reviewed:  Notable labs --   CBG's at goal in 140's -- 150's  BUN 94 >> 83 >> 67 >> 52 >> 54 improving Cr 2.87 >> 2.13 >> 1.44 >> 1.30 >> 1.22 >> 1.04 Ca 8.3  WBC 20.2 >> 13.3 >> 12.3 >> 11.8 >> 9.3 >> 10.7 Hbg 12.6 >> 11.7 >> 11.3  MRSA not detected  Imaging studies 2/22 --  -- NM perfusion scan -- non-diagnostic - low/intermediate probability for PE -- CXR -- improving but persistent RLL consolidation with central lucency  (?cavitation), trace b/l pleural effusions -- U/S dopper B/L LE's -- no DVT in either lower extremity -- U/S doppler RUE --- non-occlusive mid cephalic vein thrombus at site of prior IV   Family Communication: family updated at bedside on rounds daily.  Not present on rounds this AM, will attempt to update this afternoon.   Disposition: Status is: Inpatient  Remains inpatient appropriate because:  --still weaning down oxygen --remains on IV therapies --close monitoring of hemoptysis. --Needs SNF placement    Planned Discharge Destination: Home with Home Health      Time spent: 42 minutes   Author: Pennie Banter, DO 04/03/2023 12:03 PM  For on call review [email or web address].

## 2023-04-03 NOTE — Progress Notes (Addendum)
 Occupational Therapy Treatment Patient Details Name: John Peterson MRN: [id number] DOB: 1945-11-07 Today's Date: 04/03/2023   History of present illness Pt is a 78 year old male admitted with Acute respiratory failure with hypoxia and hypercapnia  Influenza A,Secondary to strep pneumoniae pneumonia,Right pleural effusion  Sepsis due to flu and pneumonia, AKI due to ATN, hypoglycemia, Acute metabolic encephalopathy       PMH significant for CTCL (cutaneous T-cell cell lymphoma), hyperlipidemia, hypertension, seizure, right TKA, PVD, and GERD   OT comments  John Peterson was seen for OT treatment on this date. Upon arrival to room pt in bed, agreeable to tx. Pt requires MAX A don B socks in sitting. CGA + RW for ADL t/f ~20 ft. Pt making progress toward goals, will continue to follow POC. Discharge recommendation updated to reflect increased needs.       If plan is discharge home, recommend the following:  A little help with walking and/or transfers;A little help with bathing/dressing/bathroom;Direct supervision/assist for medications management;Assistance with cooking/housework   Equipment Recommendations  BSC/3in1;Tub/shower seat    Recommendations for Other Services      Precautions / Restrictions Precautions Precautions: Fall Recall of Precautions/Restrictions: Impaired Restrictions Weight Bearing Restrictions Per Provider Order: No       Mobility Bed Mobility Overal bed mobility: Needs Assistance Bed Mobility: Supine to Sit     Supine to sit: Supervision          Transfers Overall transfer level: Needs assistance Equipment used: Rolling walker (2 wheels) Transfers: Sit to/from Stand Sit to Stand: Contact guard assist           General transfer comment: initial MIN A for posterior lean improves to CGA second trial     Balance Overall balance assessment: Needs assistance Sitting-balance support: Feet supported Sitting balance-Leahy Scale: Good     Standing  balance support: Bilateral upper extremity supported Standing balance-Leahy Scale: Fair                             ADL either performed or assessed with clinical judgement   ADL Overall ADL's : Needs assistance/impaired                                       General ADL Comments: MAX A don B socks in sitting. CGA + RW for ADL t/f     Communication Communication Communication: No apparent difficulties   Cognition Arousal: Alert Behavior During Therapy: WFL for tasks assessed/performed Cognition: Cognition impaired   Orientation impairments: Situation                           Following commands: Impaired        Cueing   Cueing Techniques: Verbal cues, Tactile cues, Visual cues  Exercises              Pertinent Vitals/ Pain       Pain Assessment Pain Assessment: No/denies pain   Frequency  Min 1X/week        Progress Toward Goals  OT Goals(current goals can now be found in the care plan section)  Progress towards OT goals: Progressing toward goals  Acute Rehab OT Goals OT Goal Formulation: With patient/family Time For Goal Achievement: 04/13/23 Potential to Achieve Goals: Good ADL Goals Pt Will Perform Grooming: with modified independence;sitting Pt  Will Perform Lower Body Dressing: with modified independence;sit to/from stand;sitting/lateral leans Pt Will Transfer to Toilet: with modified independence;ambulating Pt Will Perform Toileting - Clothing Manipulation and hygiene: with modified independence;sitting/lateral leans;sit to/from stand  Plan      Co-evaluation                 AM-PAC OT "6 Clicks" Daily Activity     Outcome Measure   Help from another person eating meals?: None Help from another person taking care of personal grooming?: None Help from another person toileting, which includes using toliet, bedpan, or urinal?: A Lot Help from another person bathing (including washing, rinsing,  drying)?: A Lot Help from another person to put on and taking off regular upper body clothing?: A Little Help from another person to put on and taking off regular lower body clothing?: A Lot 6 Click Score: 17    End of Session Equipment Utilized During Treatment: Rolling walker (2 wheels);Oxygen;Gait belt  OT Visit Diagnosis: Other abnormalities of gait and mobility (R26.89);Muscle weakness (generalized) (M62.81);Unsteadiness on feet (R26.81)   Activity Tolerance Patient tolerated treatment well;Patient limited by fatigue   Patient Left in chair;with call bell/phone within reach;with chair alarm set;with family/visitor present   Nurse Communication          Time: 1610-9604 OT Time Calculation (min): 15 min  Charges: OT General Charges $OT Visit: 1 Visit OT Treatments $Self Care/Home Management : 8-22 mins  Kathie Dike, M.S. OTR/L  04/03/23, 3:37 PM  ascom [phone]

## 2023-04-03 NOTE — Plan of Care (Signed)

## 2023-04-03 NOTE — Progress Notes (Signed)
 Physical Therapy Treatment Patient Details Name: John Peterson MRN: [id number] DOB: [birth year] Today's Date: 04/03/2023   History of Present Illness Pt is a 78 year old male admitted with Acute respiratory failure with hypoxia and hypercapnia  Influenza A,Secondary to strep pneumoniae pneumonia,Right pleural effusion  Sepsis due to flu and pneumonia, AKI due to ATN, hypoglycemia, Acute metabolic encephalopathy       PMH significant for CTCL (cutaneous T-cell cell lymphoma), hyperlipidemia, hypertension, seizure, right TKA, PVD, and GERD    PT Comments  Pt in bed, wanting to get repositioned to eat, agrees to chair.  Is able to get OOB and transfer to chair with cga/min a x 1 and verbal cues for hand placements.  Pt did state he walked to bathroom with nursing staff this am "It wasn't pretty but I made it",  Overall progressing with goals but remains with general weakness, deconditioning and activity tolerance not at baseline.  Pt remained up in chair at end of session and is encouraged to sit as long as he tolerates before getting back to bed.  Has not spent any time in chair to date.     If plan is discharge home, recommend the following: A little help with walking and/or transfers;A little help with bathing/dressing/bathroom;Assist for transportation;Help with stairs or ramp for entrance   Can travel by private vehicle        Equipment Recommendations  Rolling walker (2 wheels)    Recommendations for Other Services       Precautions / Restrictions Precautions Precautions: Fall Restrictions Weight Bearing Restrictions Per Provider Order: No     Mobility  Bed Mobility Overal bed mobility: Needs Assistance Bed Mobility: Supine to Sit     Supine to sit: Modified independent (Device/Increase time), HOB elevated, Used rails       Patient Response: Cooperative  Transfers Overall transfer level: Needs assistance Equipment used: Rolling walker (2 wheels) Transfers: Bed to  chair/wheelchair/BSC Sit to Stand: Contact guard assist, Min assist           General transfer comment: cues for hand placements    Ambulation/Gait Ambulation/Gait assistance: Min assist Gait Distance (Feet): 4 Feet Assistive device: Rolling walker (2 wheels) Gait Pattern/deviations: Step-through pattern, Decreased step length - right, Decreased step length - left Gait velocity: decreased     General Gait Details: several steps to chair.  did state he walked to bathroom with nursing this AM.  "It wasn't pretty but I made it"   Stairs             Wheelchair Mobility     Tilt Bed Tilt Bed Patient Response: Cooperative  Modified Rankin (Stroke Patients Only)       Balance Overall balance assessment: Needs assistance Sitting-balance support: Feet supported Sitting balance-Leahy Scale: Good     Standing balance support: Bilateral upper extremity supported, During functional activity, Reliant on assistive device for balance Standing balance-Leahy Scale: Fair Standing balance comment: CGA and RW use                            Communication    Cognition Arousal: Alert Behavior During Therapy: WFL for tasks assessed/performed   PT - Cognitive impairments: No apparent impairments                                Cueing    Exercises  General Comments        Pertinent Vitals/Pain Pain Assessment Pain Assessment: No/denies pain    Home Living                          Prior Function            PT Goals (current goals can now be found in the care plan section) Progress towards PT goals: Progressing toward goals    Frequency    Min 1X/week      PT Plan      Co-evaluation              AM-PAC PT "6 Clicks" Mobility   Outcome Measure  Help needed turning from your back to your side while in a flat bed without using bedrails?: A Little Help needed moving from lying on your back to sitting on the  side of a flat bed without using bedrails?: A Little Help needed moving to and from a bed to a chair (including a wheelchair)?: A Little Help needed standing up from a chair using your arms (e.g., wheelchair or bedside chair)?: A Little Help needed to walk in hospital room?: A Little Help needed climbing 3-5 steps with a railing? : A Little 6 Click Score: 18    End of Session Equipment Utilized During Treatment: Gait belt;Oxygen Activity Tolerance: Patient tolerated treatment well Patient left: in chair;with call bell/phone within reach;with chair alarm set Nurse Communication: Mobility status PT Visit Diagnosis: Muscle weakness (generalized) (M62.81);Unsteadiness on feet (R26.81)     Time: 4098-1191 PT Time Calculation (min) (ACUTE ONLY): 12 min  Charges:    $Therapeutic Activity: 8-22 mins PT General Charges $$ ACUTE PT VISIT: 1 Visit                    Danielle Dess, PTA 04/03/23, 9:34 AM

## 2023-04-04 DIAGNOSIS — J9601 Acute respiratory failure with hypoxia: Secondary | ICD-10-CM | POA: Diagnosis not present

## 2023-04-04 LAB — CBC
HCT: 33.9 % — ABNORMAL LOW (ref 39.0–52.0)
Hemoglobin: 11.4 g/dL — ABNORMAL LOW (ref 13.0–17.0)
MCH: 31.5 pg (ref 26.0–34.0)
MCHC: 33.6 g/dL (ref 30.0–36.0)
MCV: 93.6 fL (ref 80.0–100.0)
Platelets: 470 10*3/uL — ABNORMAL HIGH (ref 150–400)
RBC: 3.62 MIL/uL — ABNORMAL LOW (ref 4.22–5.81)
RDW: 14.3 % (ref 11.5–15.5)
WBC: 11.2 10*3/uL — ABNORMAL HIGH (ref 4.0–10.5)
nRBC: 0 % (ref 0.0–0.2)

## 2023-04-04 LAB — GLUCOSE, CAPILLARY: Glucose-Capillary: 115 mg/dL — ABNORMAL HIGH (ref 70–99)

## 2023-04-04 MED ORDER — IPRATROPIUM-ALBUTEROL 0.5-2.5 (3) MG/3ML IN SOLN: 3.0000 mL | Freq: Four times a day (QID) | RESPIRATORY_TRACT | Status: DC | PRN

## 2023-04-04 NOTE — Plan of Care (Signed)

## 2023-04-04 NOTE — Progress Notes (Signed)
 Occupational Therapy Treatment Patient Details Name: John Peterson MRN: [id number] DOB: Feb 11, 1945 Today's Date: 04/04/2023   History of present illness Pt is a 78 year old male admitted with Acute respiratory failure with hypoxia and hypercapnia  Influenza A,Secondary to strep pneumoniae pneumonia,Right pleural effusion  Sepsis due to flu and pneumonia, AKI due to ATN, hypoglycemia, Acute metabolic encephalopathy       PMH significant for CTCL (cutaneous T-cell cell lymphoma), hyperlipidemia, hypertension, seizure, right TKA, PVD, and GERD   OT comments  Pt seen for brief OT tx session. Pt recognizes need to work on activity tolerance and ensure his SpO2 remains good while off supplemental O2 but pt then declined exertional activity.  SpO2 at rest 91% on room air. OT facilitated problem solving for return home with ADL in particular bathing and grooming tasks with focus on activity tolerance, work simplification, home/routines modifications, AE/DME, and activity pacing. Pt verbalized understanding. Will continue to progress as able.        If plan is discharge home, recommend the following:  A little help with walking and/or transfers;A little help with bathing/dressing/bathroom;Direct supervision/assist for medications management;Assistance with cooking/housework   Equipment Recommendations  BSC/3in1;Tub/shower seat    Recommendations for Other Services      Precautions / Restrictions Precautions Precautions: Fall Recall of Precautions/Restrictions: Impaired Restrictions Weight Bearing Restrictions Per Provider Order: No       Mobility Bed Mobility               General bed mobility comments: NT in recliner    Transfers                   General transfer comment: declined     Balance                                           ADL either performed or assessed with clinical judgement   ADL                                               Extremity/Trunk Assessment              Vision       Perception     Praxis     Communication     Cognition Arousal: Alert Behavior During Therapy: WFL for tasks assessed/performed Cognition: No apparent impairments                                        Cueing      Exercises Other Exercises Other Exercises: OT facilitated problem solving for return home with ADL in particular bathing and grooming tasks with focus on activity tolerance, work simplification, home/routines modifications, AE/DME, and activity pacing. Pt verbalized understanding.    Shoulder Instructions       General Comments      Pertinent Vitals/ Pain       Pain Assessment Pain Assessment: No/denies pain  Home Living  Prior Functioning/Environment              Frequency  Min 1X/week        Progress Toward Goals  OT Goals(current goals can now be found in the care plan section)  Progress towards OT goals: Progressing toward goals  Acute Rehab OT Goals Patient Stated Goal: return home OT Goal Formulation: With patient/family Time For Goal Achievement: 04/13/23 Potential to Achieve Goals: Good  Plan      Co-evaluation                 AM-PAC OT "6 Clicks" Daily Activity     Outcome Measure   Help from another person eating meals?: None Help from another person taking care of personal grooming?: None Help from another person toileting, which includes using toliet, bedpan, or urinal?: A Lot Help from another person bathing (including washing, rinsing, drying)?: A Lot Help from another person to put on and taking off regular upper body clothing?: A Little Help from another person to put on and taking off regular lower body clothing?: A Lot 6 Click Score: 17    End of Session    OT Visit Diagnosis: Other abnormalities of gait and mobility (R26.89);Muscle weakness (generalized)  (M62.81);Unsteadiness on feet (R26.81)   Activity Tolerance Patient tolerated treatment well   Patient Left in chair;with call bell/phone within reach;with chair alarm set;with family/visitor present   Nurse Communication          Time: 1121-1130 OT Time Calculation (min): 9 min  Charges: OT General Charges $OT Visit: 1 Visit OT Treatments $Self Care/Home Management : 8-22 mins  Arman Filter., MPH, MS, OTR/L ascom [phone] 04/04/23, 12:04 PM

## 2023-04-04 NOTE — TOC Progression Note (Signed)
 Transition of Care [facility]) - Progression Note    Patient Details  Name: John Peterson MRN: [id number] Date of Birth: 03/02/1945  [facility]) CM/SW Contact  Chapman Fitch, RN Phone Number: 04/04/2023, 9:41 AM  Clinical Narrative:     Bed offers reviewed with patient and wife.   Patient states that he wishes to go home with home health services through "[facility]"  I let him know that [facility] is now called [facility].  He states "that's fine Patient still currently on acute O2  Patient states that if he does end up having to go to rehab at discharge he would like to go to [facility]   Expected Discharge Plan: Home w Home Health Services Barriers to Discharge: Continued Medical Work up  Expected Discharge Plan and Services     Post Acute Care Choice: Home Health Living arrangements for the past 2 months: Single Family Home                           HH Arranged: PT, OT, Nurse's Aide HH Agency: [facility] Date [facility] Agency Contacted: 04/01/23 Time HH Agency Contacted: 1528 Representative spoke with at [facility] Agency: Kandee Keen   Social Determinants of Health (SDOH) Interventions SDOH Screenings   Food Insecurity: Patient Unable To Answer (03/27/2023)  Housing: Patient Unable To Answer (03/27/2023)  Transportation Needs: Patient Unable To Answer (03/27/2023)  Utilities: Patient Unable To Answer (03/27/2023)  Social Connections: Unknown (03/27/2023)  Tobacco Use: Medium Risk (03/27/2023)    Readmission Risk Interventions     No data to display

## 2023-04-04 NOTE — Progress Notes (Signed)
 Progress Note   Patient: John Peterson XBJ:[id number] DOB: 1945/07/06 DOA: 03/26/2023     9 DOS: the patient was seen and examined on 04/04/2023   Brief hospital course: HPI on admission to ICU 03/26/23:  "78 y.o male with significant PMH of CTCL (cutaneous T-cell cell lymphoma), hyperlipidemia, hypertension, seizure, right TKA, PVD, and GERD who presented to the ED with chief complaints of progressive shortness of breath.   Per ED reports and admit notes, patient recently came back from a cruise with his wife who tested positive for flu. Patient developed progressive shortness of breath, generalized malaise, cough and increased work of breathing this week. Symptoms worsened over the past 4 to 5 days prior to presenting to the ED"  Pt was admitted to the ICU for further evaluation and management of sepsis secondary to pneumonia and influenza A, initially requiring intubation and mechanical ventilation from 2/17 >> 03/28/23 when pt was successfully extubated to heated high flow oxygen.    [facility] assumed care on 03/29/23.  Further hospital course and management as outlined below.   Assessment and Plan:  Acute respiratory failure with hypoxia and hypercapnia Influenza A Secondary to strep pneumoniae pneumonia Right pleural effusion Sepsis due to flu and pneumonia Necrotizing / cavitary pneumonia -- based on repeat CXR 2/21 for hemoptysis Patient almost like required intubation mechanical ventilation ICU, successfully extubated to heated high flow oxygen on 2/18. 2/19 -- Weaned from 45 L/min heated high flow to 4 L/min nasal cannula O2  2/20 -- On 4 L/min O2 this AM >> 3 L this afternoon with O2 sats 95-99% 2/21 -- Weaned to 2 L/min O2.  New onset hemoptysis overnight.  New hemoptysis.   2/22 -- case discussed with pulmonology re hemoptysis and cavitation -- all consistent with severe necrotizing pneumonia, recommend steroids and bronchodilators 2/24 -- weaned to 1 L.om O2, sat 93-95%,  hemoptysis improved but ongoing 2/25 -- weaned to room air, ambulated on room air -- Continue IV steroids -- Solu-medrol 40 mg BID -- Transition to PO prednisone 40 mg daily tomorrow AM -- taper at d/c -- Scheduled Duonebs, PRN albuterol -- Completed IV antibiotics - Cefepime, Zithromax -- Completed Tamiflu -- Sepsis physiology improved, maintain MAP above 65 -- Monitor fever curve, CBC  Hemoptysis - new onset overnight 2/20>>21.   Due to necrotizing post-flu pneumonia. 2/21 - NM perfusion scan -- low probability for PE Cavitation seen on repeat CXR 2/21 within the area of consolidation - consistent with necrotizing infection. Platelets were down at 108k transiently on 2/20 but recovered.   No DVT on U/S bilateral LE's  Hbg 12.6 >> 11.7 >> 11.3 >> 11.4, stable. 2/24-25 -- still some hemoptysis but significantly improved -- Request to RN to empty suction canister to determine extent ongoing -- Resumed prophylactic dose Lovenox  -- Monitor closely & trend Hbg -- okay to work with PT/OT  AKI due to ATN Anion gap metabolic acidosis AKI now resolved, Cr 1.22 with GFR > 60 (2/23) -- Nephrology no longer following -- I/O's, urinary output closely -- MAP above 65 for adequate renal perfusion -- Monitor electrolytes, renal parameters  Acute Urinary Retention Pt has required multiple in/out caths, but hopes to avoid Foley --Home Flomax resumed  --Bladder scans PRN --In/out if retaining > 500 cc and unable to void  Thrombocytopenia -- transient & resolved. -- Resumed Lovenox for VTE prophylaxis -- CBC's to monitor -- HIT Ab negative  Hypoglycemia due to poor p.o. intake related to acute illness and encephalopathy.  Required D5w fluids that are now discontinued. RESOLVED. -- Hypoglycemia protocol  Acute metabolic encephalopathy Due to acute illness.  Mental status improving. -- Management of underlying conditions as outlined -- Delirium precautions  Hypertension -- Continue  holding amlodipine with soft BP  Hyperlipidemia -- Continue Lipitor  Gout - stable -- Continue allopurinol  Generalized weakness / Deconditioning Due to severe acute illness --PT/OT following --SNF recommended.  Pt wants to go home with [facility] --Fall precautions      Subjective: Patient up in recliner visiting with a friend this AM. Off oxygen. Reports feeling well and ambulating better. He wants to go home and asks if he can go today.  Still cough up a little blood at times but much less.   Physical Exam: Vitals:   04/04/23 0846 04/04/23 1149 04/04/23 1155 04/04/23 1200  BP: (!) 159/79     Pulse: 83     Resp: 18     Temp: 98.1 F (36.7 C)     TempSrc: Oral     SpO2: 96% 92% 91% 91%  Weight:      Height:       General exam: awake, alert, more interactive and conversational, no acute distress HEENT: moist mucus membranes, hearing grossly normal  Respiratory system: minimal right-sided crackles, normal respiratory effort at rest on room air, no wheezing,  Cardiovascular: RRR, +S1/S2, improving edema in all extremities Gastrointestinal system: soft, NT, ND Central nervous system: A&O x 3. no gross focal neurologic deficits, normal speech Skin: dry, intact, normal temperature Psychiatry: normal mood, congruent affect    Data Reviewed:  Notable labs --   CBG's at goal   BUN 54 >> 49 improving Cr 2.87 >> 2.13 >> 1.44 >> 1.30 >> 1.22 >> 1.04 Ca 8.3  WBC 20.2 >> 13.3 >> 12.3 >> 11.8 >> 9.3 >> 10.7 >> 11.2 Hbg 12.6 >> 11.7 >> 11.3 >> 11.4    MRSA not detected  Imaging studies 2/22 --  -- NM perfusion scan -- non-diagnostic - low/intermediate probability for PE -- CXR -- improving but persistent RLL consolidation with central lucency (?cavitation), trace b/l pleural effusions -- U/S dopper B/L LE's -- no DVT in either lower extremity -- U/S doppler RUE --- non-occlusive mid cephalic vein thrombus at site of prior IV   Family Communication: wife and sister-in-law  updated in hall today   Disposition: Status is: Inpatient  Remains inpatient appropriate because:  --still weaning down oxygen --probably stable for d/c tomorrow 2/26    Planned Discharge Destination: Home with Home Health      Time spent: 38 minutes   Author: Pennie Banter, DO 04/04/2023 1:51 PM  For on call review [email or web address].

## 2023-04-05 DIAGNOSIS — J9601 Acute respiratory failure with hypoxia: Secondary | ICD-10-CM | POA: Diagnosis not present

## 2023-04-05 LAB — BASIC METABOLIC PANEL
Anion gap: 4 — ABNORMAL LOW (ref 5–15)
BUN: 34 mg/dL — ABNORMAL HIGH (ref 8–23)
CO2: 24 mmol/L (ref 22–32)
Calcium: 8.4 mg/dL — ABNORMAL LOW (ref 8.9–10.3)
Chloride: 109 mmol/L (ref 98–111)
Creatinine, Ser: 0.79 mg/dL (ref 0.61–1.24)
GFR, Estimated: >60 mL/min (ref >60)
Glucose, Bld: 88 mg/dL (ref 70–99)
Potassium: 4.4 mmol/L (ref 3.5–5.1)
Sodium: 137 mmol/L (ref 135–145)

## 2023-04-05 LAB — CBC
HCT: 34.7 % — ABNORMAL LOW (ref 39.0–52.0)
Hemoglobin: 11.8 g/dL — ABNORMAL LOW (ref 13.0–17.0)
MCH: 31.8 pg (ref 26.0–34.0)
MCHC: 34 g/dL (ref 30.0–36.0)
MCV: 93.5 fL (ref 80.0–100.0)
Platelets: 445 10*3/uL — ABNORMAL HIGH (ref 150–400)
RBC: 3.71 MIL/uL — ABNORMAL LOW (ref 4.22–5.81)
RDW: 14 % (ref 11.5–15.5)
WBC: 11.9 10*3/uL — ABNORMAL HIGH (ref 4.0–10.5)
nRBC: 0 % (ref 0.0–0.2)

## 2023-04-05 MED ORDER — VITAMIN B-1 100 MG PO TABS: 100.0000 mg | ORAL_TABLET | Freq: Every day | ORAL | 0 refills | Status: AC

## 2023-04-05 MED ORDER — PANTOPRAZOLE SODIUM 40 MG PO TBEC: 40.0000 mg | DELAYED_RELEASE_TABLET | Freq: Every day | ORAL | 0 refills | Status: AC

## 2023-04-05 MED ORDER — FOLIC ACID 1 MG PO TABS: 1.0000 mg | ORAL_TABLET | Freq: Every day | ORAL | 0 refills | Status: AC

## 2023-04-05 MED ORDER — PREDNISONE 10 MG PO TABS: ORAL_TABLET | ORAL | 0 refills | Status: AC

## 2023-04-05 MED ORDER — DOCUSATE SODIUM 100 MG PO CAPS: 100.0000 mg | ORAL_CAPSULE | Freq: Two times a day (BID) | ORAL | 0 refills | Status: AC | PRN

## 2023-04-05 NOTE — Discharge Summary (Signed)
 Physician Discharge Summary   Patient: John Peterson MRN: [id number] DOB: [birth year]  Admit date:     03/26/2023  Discharge date: 04/05/23  Discharge Physician: Loyce Dys   PCP: Gracelyn Nurse, MD   Recommendations at discharge:  Follow-up with PCP   Discharge Diagnoses: Acute respiratory failure with hypoxia and hypercapnia Influenza A Secondary to strep pneumoniae pneumonia Right pleural effusion Sepsis due to flu and pneumonia Necrotizing / cavitary pneumonia -- based on repeat CXR 2/21 for hemoptysis Hemoptysis - new onset overnight 2/20>>21.   AKI due to ATN Anion gap metabolic acidosis Acute Urinary Retention Thrombocytopenia -- transient & resolved. Hypoglycemia due to poor p.o. intake related to acute illness and encephalopathy. Acute metabolic encephalopathy Hypertension Hyperlipidemia Gout - stable Generalized weakness / Deconditioning    Hospital Course: 78 y.o male with significant PMH of CTCL (cutaneous T-cell cell lymphoma), hyperlipidemia, hypertension, seizure, right TKA, PVD, and GERD who presented to the ED with chief complaints of progressive shortness of breath.   Per ED reports and admit notes, patient recently came back from a cruise with his wife who tested positive for flu. Patient developed progressive shortness of breath, generalized malaise, cough and increased work of breathing this week. Symptoms worsened over the past 4 to 5 days prior to presenting to the ED"   Pt was admitted to the ICU for further evaluation and management of sepsis secondary to pneumonia and influenza A, initially requiring intubation and mechanical ventilation from 2/17 >> 03/28/23 when pt was successfully extubated to heated high flow oxygen.  Has improved remarkably with physical therapy FemTab being discharged home with home health   Consultants: Intensivist and pulmonologist Procedures performed: As mentioned above Disposition: Home health Diet recommendation:   Cardiac diet DISCHARGE MEDICATION: Allergies as of 04/05/2023       Reactions   Tramadol Other (See Comments)   PATIENT PREFERS TO AVOID ALL OPOIDS        Medication List     STOP taking these medications    celecoxib 200 MG capsule Commonly known as: CELEBREX   lisinopril 40 MG tablet Commonly known as: ZESTRIL       TAKE these medications    acetaminophen 500 MG tablet Commonly known as: TYLENOL Take 1,000 mg by mouth in the morning.   allopurinol 100 MG tablet Commonly known as: ZYLOPRIM Take 100 mg by mouth daily.   amLODipine 10 MG tablet Commonly known as: NORVASC Take 10 mg by mouth daily.   aspirin EC 81 MG tablet Take 81 mg by mouth daily. Swallow whole.   atorvastatin 10 MG tablet Commonly known as: LIPITOR Take 1 tablet by mouth daily.   desonide 0.05 % cream Commonly known as: DESOWEN Apply 1 Application topically 2 (two) times daily.   docusate sodium 100 MG capsule Commonly known as: COLACE Take 1 capsule (100 mg total) by mouth 2 (two) times daily as needed for mild constipation.   famotidine 20 MG tablet Commonly known as: PEPCID Take 20 mg by mouth at bedtime.   folic acid 1 MG tablet Commonly known as: FOLVITE Take 1 tablet (1 mg total) by mouth daily. Start taking on: April 06, 2023   ketoconazole 2 % cream Commonly known as: NIZORAL Apply 1 Application topically daily. Apply to feet   loratadine 10 MG tablet Commonly known as: CLARITIN Take 10 mg by mouth daily.   multivitamin with minerals tablet Take 1 tablet by mouth daily.   pantoprazole 40 MG tablet Commonly known  as: PROTONIX Take 1 tablet (40 mg total) by mouth daily at 8 pm.   predniSONE 10 MG tablet Commonly known as: DELTASONE Take 4 tablets (40 mg total) by mouth daily for 2 days, THEN 2 tablets (20 mg total) daily for 2 days, THEN 1 tablet (10 mg total) daily for 3 days. Start taking on: April 05, 2023   tamsulosin 0.4 MG Caps capsule Commonly  known as: FLOMAX Take 0.4 mg by mouth daily after supper.   thiamine 100 MG tablet Commonly known as: Vitamin B-1 Take 1 tablet (100 mg total) by mouth daily. Start taking on: April 06, 2023        Discharge Exam: Ceasar Mons Weights   04/03/23 1610 04/04/23 0456 04/05/23 0313  Weight: 105.3 kg 103.8 kg 104.7 kg   General exam: awake, alert, more interactive and conversational, no acute distress HEENT: moist mucus membranes, hearing grossly normal  Respiratory system: minimal right-sided crackles, normal respiratory effort at rest on room air, no wheezing,  Cardiovascular: RRR, +S1/S2, improving edema in all extremities Gastrointestinal system: soft, NT, ND Central nervous system: A&O x 3. no gross focal neurologic deficits, normal speech Skin: dry, intact, normal temperature Psychiatry: normal mood, congruent affect    Condition at discharge: good  The results of significant diagnostics from this hospitalization (including imaging, microbiology, ancillary and laboratory) are listed below for reference.   Imaging Studies: US Venous Img Upper Uni Right(DVT) Result Date: 03/31/2023 CLINICAL DATA:  Hypoxia EXAM: RIGHT UPPER EXTREMITY VENOUS DOPPLER ULTRASOUND TECHNIQUE: Gray-scale sonography with graded compression, as well as color Doppler and duplex ultrasound were performed to evaluate the upper extremity deep venous system from the level of the subclavian vein and including the jugular, axillary, basilic, radial, ulnar and upper cephalic vein. Spectral Doppler was utilized to evaluate flow at rest and with distal augmentation maneuvers. COMPARISON:  None Available. FINDINGS: Contralateral Subclavian Vein: Respiratory phasicity is normal and symmetric with the symptomatic side. No evidence of thrombus. Normal compressibility. Internal Jugular Vein: No evidence of thrombus. Normal compressibility, respiratory phasicity and response to augmentation. Subclavian Vein: No evidence of  thrombus. Normal compressibility, respiratory phasicity and response to augmentation. Axillary Vein: No evidence of thrombus. Normal compressibility, respiratory phasicity and response to augmentation. Cephalic Vein: There is nonocclusive eccentric thrombus within the mid to distal right cephalic vein at the site of the patient's prior IV. No evidence of occlusive thrombus elsewhere within the right cephalic vein. Basilic Vein: No evidence of thrombus. Normal compressibility, respiratory phasicity and response to augmentation. Brachial Veins: No evidence of thrombus. Normal compressibility, respiratory phasicity and response to augmentation. Radial Veins: No evidence of thrombus. Normal compressibility, respiratory phasicity and response to augmentation. Ulnar Veins: No evidence of thrombus. Normal compressibility, respiratory phasicity and response to augmentation. Other Findings:  None visualized. IMPRESSION: 1. 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 2. Otherwise no evidence of acute deep venous thrombosis elsewhere within the right upper extremity. These results will be called to the ordering clinician or representative by the Radiologist Assistant, and communication documented in the PACS or [employer]. Electronically Signed   By: Sharlet Salina M.D.   On: 03/31/2023 19:59   US Venous Img Lower Bilateral (DVT) Result Date: 03/31/2023 CLINICAL DATA:  Hypoxia, edema EXAM: BILATERAL LOWER EXTREMITY VENOUS DOPPLER ULTRASOUND TECHNIQUE: Gray-scale sonography with compression, as well as color and duplex ultrasound, were performed to evaluate the deep venous  system(s) from the level of the common femoral vein through the popliteal and proximal calf veins. COMPARISON:  12/03/2014 FINDINGS: VENOUS Normal compressibility of the common femoral, superficial femoral, and popliteal veins, as well as  the visualized calf veins. Visualized portions of profunda femoral vein and great saphenous vein unremarkable. No filling defects to suggest DVT on grayscale or color Doppler imaging. Doppler waveforms show normal direction of venous flow, normal respiratory plasticity and response to augmentation. OTHER None. Limitations: none IMPRESSION: 1. No evidence of deep venous thrombosis within either lower extremity. Electronically Signed   By: Sharlet Salina M.D.   On: 03/31/2023 19:57   NM Pulmonary Perfusion Result Date: 03/31/2023 CLINICAL DATA:  Progressive shortness of breath, positive D-dimer EXAM: NUCLEAR MEDICINE PERFUSION LUNG SCAN TECHNIQUE: Perfusion images were obtained in multiple projections after intravenous injection of radiopharmaceutical. Ventilation scans intentionally deferred if perfusion scan and chest x-ray adequate for interpretation during COVID 19 epidemic. RADIOPHARMACEUTICALS:  4.3 mCi Tc-71m MAA IV COMPARISON:  Chest x-ray 03/31/2023 and 03/28/2023 FINDINGS: Planar images of the lungs are obtained in multiple projections during the perfusion evaluation. There is a large wedge-shaped perfusion defect within the right lower lobe, extending from the superior segment into the lateral and posterior basilar segments of the right lower lobe. This is similar in size to corresponding chest x-ray finding of right lower lobe consolidation. No perfusion defects within the left lung. IMPRESSION: 1. Nondiagnostic (low or intermediate probability) study for pulmonary embolus based on modified PIOPED II perfusion only criteria, given matching x-ray findings in the region of perfusion defect. These results will be called to the ordering clinician or representative by the Radiologist Assistant, and communication documented in the PACS or [employer]. Electronically Signed   By: Sharlet Salina M.D.   On: 03/31/2023 15:11   DG Chest Port 1 View Result Date: 03/31/2023 CLINICAL DATA:  Hypoxia EXAM:  PORTABLE CHEST 1 VIEW COMPARISON:  03/28/2023 FINDINGS: Single frontal view of the chest demonstrates persistent right basilar consolidation, with slight improvement since the 03/28/2023 exam. Rounded area of lucency within the consolidated right lower lobe could reflect focal increased aeration versus developing cavitation. Trace bilateral effusions. No pneumothorax. Cardiac silhouette is stable. IMPRESSION: 1. Persistent but improving right lower lobe consolidation, with rounded lucency in the residual consolidated right lower lobe concerning for cavitating pneumonia. 2. Trace bilateral effusions. Electronically Signed   By: Sharlet Salina M.D.   On: 03/31/2023 15:09   DG Chest 1 View Result Date: 03/28/2023 CLINICAL DATA:  78 year old male "pulmonary edema", intubated. EXAM: CHEST  1 VIEW COMPARISON:  Portable chest 03/27/2023 and earlier. FINDINGS: Portable AP upright view at 0554 hours. Stable visible endotracheal and enteric tubes. Moderate veiling opacity in the right lower lung is stable from the CT on 03/26/2023 which demonstrated combination of consolidated right lower lobe, small superimposed right pleural effusion. Small contralateral left pleural effusion now suspected and stable since yesterday. Stable cardiac size and mediastinal contours. No pneumothorax. Aerated upper lung pulmonary vascularity appears normal. Paucity of bowel gas. Stable visualized osseous structures. IMPRESSION: 1.  Stable lines and tubes. 2. No improvement in severe right lower lobe consolidation since the CT on 03/26/2023. Small volume superimposed pleural effusions. No evidence of pulmonary edema. Electronically Signed   By: Odessa Fleming M.D.   On: 03/28/2023 06:26   [place] CHEST (PLEURAL EFFUSION) Result Date: 03/27/2023 CLINICAL DATA:  Limited ultrasound of the chest done prior to possible thoracentesis. EXAM: CHEST ULTRASOUND COMPARISON:  Chest XR, 03/27/2023 FINDINGS: There  is only a small pleural effusion present with no  window to allow for safe approach for thoracentesis. IMPRESSION: Small pleural effusion. Thoracentesis was NOT performed Performed by Buzzy Han, PA-C Electronically Signed   By: Roanna Banning M.D.   On: 03/27/2023 17:13   DG Abdomen 1 View Result Date: 03/27/2023 CLINICAL DATA:  Orogastric tube placement. EXAM: ABDOMEN - 1 VIEW COMPARISON:  None Available. FINDINGS: The bowel gas pattern is non-obstructive. Gaseous distention of the stomach noted. Otherwise there is paucity of bowel gas. No evidence of pneumoperitoneum, within the limitations of a supine film. No acute osseous abnormalities. The soft tissues are within normal limits. Surgical changes, devices, tubes and lines: Orogastric tube noted with its tip and side hole overlying the left upper quadrant over the fundus/proximal stomach body region. IMPRESSION: *Orogastric tube noted with its tip and side hole overlying the left upper quadrant over the fundus/proximal stomach body region. Electronically Signed   By: Jules Schick M.D.   On: 03/27/2023 08:15   DG Chest Portable 1 View Result Date: 03/27/2023 CLINICAL DATA:  Intubation EXAM: PORTABLE CHEST 1 VIEW COMPARISON:  Earlier today FINDINGS: New endotracheal tube with tip just below the clavicular heads. The enteric tube at least reaches the gastric bubble. Right more than left lower chest opacification, extensive right lower lobe consolidation by recent CT. No pneumothorax. IMPRESSION: 1. New hardware in unremarkable position. 2. Stable aeration with right lower lobe consolidation. Electronically Signed   By: Tiburcio Pea M.D.   On: 03/27/2023 07:04   DG Chest Port 1 View Result Date: 03/27/2023 CLINICAL DATA:  Shortness of breath EXAM: PORTABLE CHEST 1 VIEW COMPARISON:  Yesterday FINDINGS: Airspace disease and pleural fluid opacifies the right base. Mild streaky opacity over the left diaphragm. No pneumothorax. Normal heart size and mediastinal contours. IMPRESSION: Stable pneumonia and  pleural fluid at the right base. Electronically Signed   By: Tiburcio Pea M.D.   On: 03/27/2023 05:41   US RENAL Result Date: 03/26/2023 CLINICAL DATA:  Acute kidney injury EXAM: RENAL / URINARY TRACT ULTRASOUND COMPLETE COMPARISON:  None Available. FINDINGS: Right Kidney: Renal measurements: 12.6 x 6.3 x 6.2 cm = volume: 255 mL. Echogenicity within normal limits. No mass or hydronephrosis visualized. Left Kidney: Renal measurements: 12.5 x 5.9 by 4.8 cm = volume: 187 mL. 7.9 cm simple appearing cyst in the mid to lower pole. No follow-up imaging recommended. Normal echotexture. No hydronephrosis. Bladder: Appears normal for degree of bladder distention. Other: None. IMPRESSION: No acute findings.  No hydronephrosis. Electronically Signed   By: Charlett Nose M.D.   On: 03/26/2023 19:32   CT CHEST WO CONTRAST Result Date: 03/26/2023 CLINICAL DATA:  Respiratory illness, nondiagnostic xray EXAM: CT CHEST WITHOUT CONTRAST TECHNIQUE: Multidetector CT imaging of the chest was performed following the standard protocol without IV contrast. RADIATION DOSE REDUCTION: This exam was performed according to the departmental dose-optimization program which includes automated exposure control, adjustment of the mA and/or kV according to patient size and/or use of iterative reconstruction technique. COMPARISON:  Same day chest x-ray FINDINGS: Cardiovascular: Heart size within normal limits. No pericardial effusion. Thoracic aorta is nonaneurysmal. Scattered atherosclerotic vascular calcifications of the aorta and coronary arteries. Central pulmonary vasculature is nondilated. Mediastinum/Nodes: Several borderline enlarged mediastinal lymph nodes including 10 mm pretracheal node (series 2, image 42). No axillary lymphadenopathy. Evaluation of the hilar structures is limited in the absence of intravenous contrast. Within this limitation, no obvious hilar adenopathy or mass is identified. Lungs/Pleura: Complete consolidation of  the right lower lobe with air bronchograms. Small-moderate right pleural effusion. Mild subsegmental atelectasis at the left lung base. No left-sided pleural effusion. No pneumothorax. Upper Abdomen: No acute abnormality. Musculoskeletal: No chest wall mass or suspicious bone lesions identified. IMPRESSION: 1. Complete consolidation of the right lower lobe with air bronchograms, compatible with pneumonia. 2. Small-moderate right pleural effusion. 3. Several borderline enlarged mediastinal lymph nodes, likely reactive. 4. Aortic and coronary artery atherosclerosis (ICD10-I70.0). Electronically Signed   By: Duanne Guess D.O.   On: 03/26/2023 18:37   DG Chest 2 View Result Date: 03/26/2023 CLINICAL DATA:  SOB EXAM: CHEST - 2 VIEW COMPARISON:  None Available. FINDINGS: Right basilar opacity consistent with a large layering pleural effusion. Linear subsegmental atelectasis at the left base. Normal pulmonary vasculature. No pneumothorax. Unremarkable cardiac silhouette. Calcified aorta. IMPRESSION: Large right base alveolar opacity consistent with layering pleural effusion. Electronically Signed   By: Layla Maw M.D.   On: 03/26/2023 09:55    Microbiology: Results for orders placed or performed during the hospital encounter of 03/26/23  Resp panel by RT-PCR (RSV, Flu A&B, Covid) Anterior Nasal Swab     Status: Abnormal   Collection Time: 03/26/23  9:34 AM   Specimen: Anterior Nasal Swab  Result Value Ref Range Status   SARS Coronavirus 2 by RT PCR NEGATIVE NEGATIVE Final    Comment: (NOTE) SARS-CoV-2 target nucleic acids are NOT DETECTED.  The SARS-CoV-2 RNA is generally detectable in upper respiratory specimens during the acute phase of infection. The lowest concentration of SARS-CoV-2 viral copies this assay can detect is 138 copies/mL. A negative result does not preclude SARS-Cov-2 infection and should not be used as the sole basis for treatment or other patient management decisions. A  negative result may occur with  improper specimen collection/handling, submission of specimen other than nasopharyngeal swab, presence of viral mutation(s) within the areas targeted by this assay, and inadequate number of viral copies(<138 copies/mL). A negative result must be combined with clinical observations, patient history, and epidemiological information. The expected result is Negative.  Fact Sheet for Patients:  BloggerCourse.com  Fact Sheet for Healthcare Providers:  SeriousBroker.it  This test is no t yet approved or cleared by the [place] FDA and  has been authorized for detection and/or diagnosis of SARS-CoV-2 by FDA under an Emergency Use Authorization (EUA). This EUA will remain  in effect (meaning this test can be used) for the duration of the COVID-19 declaration under Section 564(b)(1) of the Act, 21 U.S.C.section 360bbb-3(b)(1), unless the authorization is terminated  or revoked sooner.       Influenza A by PCR POSITIVE (A) NEGATIVE Final   Influenza B by PCR NEGATIVE NEGATIVE Final    Comment: (NOTE) The Xpert Xpress SARS-CoV-2/FLU/RSV plus assay is intended as an aid in the diagnosis of influenza from Nasopharyngeal swab specimens and should not be used as a sole basis for treatment. Nasal washings and aspirates are unacceptable for Xpert Xpress SARS-CoV-2/FLU/RSV testing.  Fact Sheet for Patients: BloggerCourse.com  Fact Sheet for Healthcare Providers: SeriousBroker.it  This test is not yet approved or cleared by the [place] FDA and has been authorized for detection and/or diagnosis of SARS-CoV-2 by FDA under an Emergency Use Authorization (EUA). This EUA will remain in effect (meaning this test can be used) for the duration of the COVID-19 declaration under Section 564(b)(1) of the Act, 21 U.S.C. section 360bbb-3(b)(1), unless the  authorization is terminated or revoked.     Resp Syncytial Virus by PCR  NEGATIVE NEGATIVE Final    Comment: (NOTE) Fact Sheet for Patients: BloggerCourse.com  Fact Sheet for Healthcare Providers: SeriousBroker.it  This test is not yet approved or cleared by the [place] FDA and has been authorized for detection and/or diagnosis of SARS-CoV-2 by FDA under an Emergency Use Authorization (EUA). This EUA will remain in effect (meaning this test can be used) for the duration of the COVID-19 declaration under Section 564(b)(1) of the Act, 21 U.S.C. section 360bbb-3(b)(1), unless the authorization is terminated or revoked.  Performed at [facility], [address] [place], [place] [address]   Respiratory (~20 pathogens) panel by PCR     Status: Abnormal   Collection Time: 03/27/23  6:48 AM   Specimen: Nasopharyngeal Swab; Respiratory  Result Value Ref Range Status   Adenovirus NOT DETECTED NOT DETECTED Final   Coronavirus 229E NOT DETECTED NOT DETECTED Final    Comment: (NOTE) The Coronavirus on the Respiratory Panel, DOES NOT test for the novel  Coronavirus (2019 nCoV)    Coronavirus HKU1 NOT DETECTED NOT DETECTED Final   Coronavirus NL63 NOT DETECTED NOT DETECTED Final   Coronavirus OC43 NOT DETECTED NOT DETECTED Final   Metapneumovirus NOT DETECTED NOT DETECTED Final   Rhinovirus / Enterovirus NOT DETECTED NOT DETECTED Final   Influenza A H1 2009 DETECTED (A) NOT DETECTED Final   Influenza B NOT DETECTED NOT DETECTED Final   Parainfluenza Virus 1 NOT DETECTED NOT DETECTED Final   Parainfluenza Virus 2 NOT DETECTED NOT DETECTED Final   Parainfluenza Virus 3 NOT DETECTED NOT DETECTED Final   Parainfluenza Virus 4 NOT DETECTED NOT DETECTED Final   Respiratory Syncytial Virus NOT DETECTED NOT DETECTED Final   Bordetella pertussis NOT DETECTED NOT DETECTED Final   Bordetella Parapertussis NOT DETECTED NOT DETECTED  Final   Chlamydophila pneumoniae NOT DETECTED NOT DETECTED Final   Mycoplasma pneumoniae NOT DETECTED NOT DETECTED Final    Comment: Performed at [facility], [address] [place], [place] [address]  MRSA Next Gen by PCR, Nasal     Status: None   Collection Time: 03/27/23  8:26 AM   Specimen: Nasal Mucosa; Nasal Swab  Result Value Ref Range Status   MRSA by PCR Next Gen NOT DETECTED NOT DETECTED Final    Comment: (NOTE) The GeneXpert MRSA Assay (FDA approved for NASAL specimens only), is one component of a comprehensive MRSA colonization surveillance program. It is not intended to diagnose MRSA infection nor to guide or monitor treatment for MRSA infections. Test performance is not FDA approved in patients less than 64 years old. Performed at [facility], [address] [place], [place] [address]   Culture, Respiratory w Gram Stain     Status: None   Collection Time: 03/28/23  4:38 AM   Specimen: SPU  Result Value Ref Range Status   Specimen Description   Final    SPUTUM Performed at [facility], [address], [place], [place] [address]    Special Requests   Final    NONE Performed at [facility], [address] [place], [place] [address]    Gram Stain   Final    FEW WBC PRESENT, PREDOMINANTLY PMN RARE GRAM POSITIVE COCCI IN PAIRS    Culture   Final    NO GROWTH 2 DAYS Performed at [facility], [address], [place], [place] [address]    Report Status 03/30/2023 FINAL  Final  Culture, blood (Routine X 2) w Reflex  to ID Panel     Status: None   Collection Time: 03/28/23  6:18 AM   Specimen: BLOOD  Result Value Ref Range Status   Specimen Description BLOOD BLOOD RIGHT ARM  Final   Special Requests   Final    BOTTLES DRAWN AEROBIC AND ANAEROBIC Blood Culture adequate volume   Culture   Final    NO GROWTH 5 DAYS Performed at [facility], [address] [place], [place] [address]    Report Status  04/02/2023 FINAL  Final  Culture, blood (Routine X 2) w Reflex to ID Panel     Status: None   Collection Time: 03/28/23  6:18 AM   Specimen: BLOOD  Result Value Ref Range Status   Specimen Description BLOOD BLOOD RIGHT HAND ANAEROBIC BOTTLE ONLY  Final   Special Requests   Final    BOTTLES DRAWN AEROBIC ONLY Blood Culture adequate volume   Culture   Final    NO GROWTH 5 DAYS Performed at [facility], [address], [place], [place] [address]    Report Status 04/02/2023 FINAL  Final  MRSA Next Gen by PCR, Nasal     Status: None   Collection Time: 04/01/23 10:20 AM   Specimen: Nasal Mucosa; Nasal Swab  Result Value Ref Range Status   MRSA by PCR Next Gen NOT DETECTED NOT DETECTED Final    Comment: (NOTE) The GeneXpert MRSA Assay (FDA approved for NASAL specimens only), is one component of a comprehensive MRSA colonization surveillance program. It is not intended to diagnose MRSA infection nor to guide or monitor treatment for MRSA infections. Test performance is not FDA approved in patients less than 10 years old. Performed at [facility], [address] [place], [place] [address]     Labs: CBC: Recent Labs  Lab 04/01/23 [phone] 04/02/23 0428 04/03/23 0416 04/04/23 0451 04/05/23 0459  WBC 11.8* 9.3 10.7* 11.2* 11.9*  HGB 12.6* 11.7* 11.3* 11.4* 11.8*  HCT 37.3* 34.9* 34.0* 33.9* 34.7*  MCV 93.7 95.1 94.7 93.6 93.5  PLT 383 432* 459* 470* 445*   Basic Metabolic Panel: Recent Labs  Lab 03/30/23 0428 03/31/23 0523 04/01/23 0653 04/02/23 0428 04/03/23 0416 04/05/23 0459  NA 137 139 144 140 141 137  K 4.4 4.4 4.4 4.5 4.7 4.4  CL 105 109 112* 109 108 109  CO2 23 23 24 22 23 24   GLUCOSE 77 83 91 173* 116* 88  BUN 83* 67* 52* 54* 49* 34*  CREATININE 2.13* 1.44* 1.30* 1.22 1.04 0.79  CALCIUM 8.0* 7.9* 8.2* 8.1* 8.3* 8.4*  MG 3.0* 2.5*  --   --   --   --   PHOS 4.3 3.1  --   --   --   --    Liver Function Tests: Recent Labs  Lab  03/30/23 0428 03/31/23 0523  ALBUMIN 1.9* 1.8*   CBG: Recent Labs  Lab 04/02/23 0554 04/02/23 1114 04/02/23 2116 04/03/23 2054 04/04/23 2220  GLUCAP 146* 144* 156* 136* 115*    Discharge time spent: greater than 30 minutes.  Signed: Loyce Dys, MD [facility] 04/05/2023

## 2023-04-05 NOTE — TOC Progression Note (Signed)
 Transition of Care [facility]) - Progression Note    Patient Details  Name: John Peterson MRN: [id number] Date of Birth: 1945-02-14  Transition of Care [facility]) CM/SW Contact  Chapman Fitch, RN Phone Number: 04/05/2023, 12:54 PM  Clinical Narrative:     Met with patient and wife at bedside.  They both confirm plan will be home health services through [facility] at discharge.  Referral made to [place] with [facility].  Patient has been weaned to RA  Expected Discharge Plan: Home w Home Health Services Barriers to Discharge: Continued Medical Work up  Expected Discharge Plan and Services     Post Acute Care Choice: Home Health Living arrangements for the past 2 months: Single Family Home                           HH Arranged: PT, OT, Nurse's Aide HH Agency: [facility] Date [facility] Agency Contacted: 04/01/23 Time HH Agency Contacted: [phone] Representative spoke with at [facility] Agency: Kandee Keen   Social Determinants of Health (SDOH) Interventions SDOH Screenings   Food Insecurity: Patient Unable To Answer (03/27/2023)  Housing: Patient Unable To Answer (03/27/2023)  Transportation Needs: Patient Unable To Answer (03/27/2023)  Utilities: Patient Unable To Answer (03/27/2023)  Social Connections: Unknown (03/27/2023)  Tobacco Use: Medium Risk (03/27/2023)    Readmission Risk Interventions     No data to display

## 2023-04-05 NOTE — Plan of Care (Signed)

## 2023-06-05 ENCOUNTER — Other Ambulatory Visit (INDEPENDENT_AMBULATORY_CARE_PROVIDER_SITE_OTHER): Payer: Self-pay | Admitting: Vascular Surgery

## 2023-06-05 DIAGNOSIS — I6523 Occlusion and stenosis of bilateral carotid arteries: Secondary | ICD-10-CM

## 2023-06-16 ENCOUNTER — Ambulatory Visit (INDEPENDENT_AMBULATORY_CARE_PROVIDER_SITE_OTHER): Payer: Medicare PPO

## 2023-06-16 ENCOUNTER — Ambulatory Visit (INDEPENDENT_AMBULATORY_CARE_PROVIDER_SITE_OTHER): Payer: Medicare PPO | Admitting: Vascular Surgery

## 2023-06-16 ENCOUNTER — Encounter (INDEPENDENT_AMBULATORY_CARE_PROVIDER_SITE_OTHER): Payer: Self-pay | Admitting: Vascular Surgery

## 2023-06-16 VITALS — BP 147/76 | HR 76 | Resp 17 | Ht 70.0 in | Wt 229.2 lb

## 2023-06-16 DIAGNOSIS — I6529 Occlusion and stenosis of unspecified carotid artery: Secondary | ICD-10-CM

## 2023-06-16 DIAGNOSIS — I10 Essential (primary) hypertension: Secondary | ICD-10-CM

## 2023-06-16 DIAGNOSIS — I6523 Occlusion and stenosis of bilateral carotid arteries: Secondary | ICD-10-CM

## 2023-06-16 DIAGNOSIS — E785 Hyperlipidemia, unspecified: Secondary | ICD-10-CM | POA: Diagnosis not present

## 2023-06-16 NOTE — Progress Notes (Signed)
 Subjective:    Patient ID: John Peterson, male    DOB: 01-11-1946, 78 y.o.   MRN: [id number] Chief Complaint  Patient presents with   Venous Insufficiency    Patient returns in follow-up of his carotid disease.  He is doing well today.  He has no focal neurologic symptoms. Specifically, the patient denies amaurosis fugax, speech or swallowing difficulties, or arm or leg weakness or numbness.  Carotid duplex reveals 1 to 39% right ICA stenosis and no significant left ICA disease. We had also originally seen him for swelling.  He since had both knees replaced and his leg swelling remains the same.  Minimal swelling symptoms which are controlled with elevation and compression at this point. Patient endorses after being in the hospital in February for pneumonia it has been difficult for him to exercise and feels his leg swelling s more evident but he is very informed in how to treat this and is willing to do so.      Review of Systems  Constitutional: Negative.   HENT: Negative.    Respiratory: Negative.    Cardiovascular: Negative.   Gastrointestinal: Negative.   Genitourinary: Negative.   Musculoskeletal: Negative.   Skin: Negative.   Neurological: Negative.   Psychiatric/Behavioral: Negative.    All other systems reviewed and are negative.      Objective:    Physical Exam Vitals reviewed.  Constitutional:      Appearance: Normal appearance.  HENT:     Head: Normocephalic.  Eyes:     Pupils: Pupils are equal, round, and reactive to light.  Cardiovascular:     Rate and Rhythm: Normal rate and regular rhythm.     Pulses: Normal pulses.     Heart sounds: Normal heart sounds.  Pulmonary:     Effort: Pulmonary effort is normal.     Breath sounds: Normal breath sounds.  Abdominal:     General: Abdomen is flat. Bowel sounds are normal.     Palpations: Abdomen is soft.  Musculoskeletal:     Right lower leg: Edema present.     Left lower leg: Edema present.  Skin:     General: Skin is warm and dry.     Capillary Refill: Capillary refill takes 2 to 3 seconds.  Neurological:     General: No focal deficit present.     Mental Status: He is alert and oriented to person, place, and time. Mental status is at baseline.  Psychiatric:        Mood and Affect: Mood normal.        Behavior: Behavior normal.        Thought Content: Thought content normal.        Judgment: Judgment normal.     BP (!) 147/76 (BP Location: Left Arm, Patient Position: Sitting, Cuff Size: Large)   Pulse 76   Resp 17   Ht 5\' 10"  (1.778 m)   Wt 229 lb 3.2 oz (104 kg)   BMI 32.89 kg/m   Past Medical History:  Diagnosis Date   Allergy    Arthritis    Cancer (HCC)    CTCL   GERD (gastroesophageal reflux disease)    Hx of dysplastic nevus 06/30/2009   L mid back, mild atypia   Hx of dysplastic nevus 09/23/2014   L posterior deltoid, moderate to severe atypia, excised   Hx of dysplastic nevus 09/14/2017   R lateral foot, mild atypia   Hyperlipidemia    Hypertension  Peripheral vascular disease (HCC)     Social History   Socioeconomic History   Marital status: Married    Spouse name: Not on file   Number of children: Not on file   Years of education: Not on file   Highest education level: Not on file  Occupational History   Not on file  Tobacco Use   Smoking status: Former    Current packs/day: 0.00    Types: Cigarettes    Quit date: 43    Years since quitting: 57.3   Smokeless tobacco: Never  Vaping Use   Vaping status: Never Used  Substance and Sexual Activity   Alcohol use: Yes    Alcohol/week: 7.0 standard drinks of alcohol    Types: 7 Cans of beer per week   Drug use: No   Sexual activity: Not on file  Other Topics Concern   Not on file  Social History Narrative   Not on file   Social Drivers of Health   Financial Resource Strain: Not on file  Food Insecurity: Patient Unable To Answer (03/27/2023)   Hunger Vital Sign    Worried About Running  Out of Food in the Last Year: Patient unable to answer    Ran Out of Food in the Last Year: Patient unable to answer  Transportation Needs: Patient Unable To Answer (03/27/2023)   PRAPARE - Transportation    Lack of Transportation (Medical): Patient unable to answer    Lack of Transportation (Non-Medical): Patient unable to answer  Physical Activity: Not on file  Stress: Not on file  Social Connections: Unknown (03/27/2023)   Social Connection and Isolation Panel [NHANES]    Frequency of Communication with Friends and Family: Patient unable to answer    Frequency of Social Gatherings with Friends and Family: Patient unable to answer    Attends Religious Services: Patient unable to answer    Active Member of Clubs or Organizations: Patient unable to answer    Attends Banker Meetings: Patient unable to answer    Marital Status: Married  Catering manager Violence: Patient Unable To Answer (03/27/2023)   Humiliation, Afraid, Rape, and Kick questionnaire    Fear of Current or Ex-Partner: Patient unable to answer    Emotionally Abused: Patient unable to answer    Physically Abused: Patient unable to answer    Sexually Abused: Patient unable to answer    Past Surgical History:  Procedure Laterality Date   HYDROCELE EXCISION  11/1998   JOINT REPLACEMENT Left 03/06/2013   L Knee Medial Compartment Makoplasty   JOINT REPLACEMENT Left 03/02/2016   L Knee Unicondylar converted to Total Replacement Mako   MOLE REMOVAL  07/2017   TOTAL KNEE ARTHROPLASTY Right 04/02/2019   Procedure: TOTAL KNEE ARTHROPLASTY;  Surgeon: Elner Hahn, MD;  Location: [facility] ORS;  Service: Orthopedics;  Laterality: Right;    History reviewed. No pertinent family history.  Allergies  Allergen Reactions   Tramadol  Other (See Comments)    PATIENT PREFERS TO AVOID ALL OPOIDS       Latest Ref Rng & Units 04/05/2023    4:59 AM 04/04/2023    4:51 AM 04/03/2023    4:16 AM  CBC  WBC 4.0 - 10.5 K/uL 11.9   11.2  10.7   Hemoglobin 13.0 - 17.0 g/dL 96.0  45.4  09.8   Hematocrit 39.0 - 52.0 % 34.7  33.9  34.0   Platelets 150 - 400 K/uL 445  470  459  CMP     Component Value Date/Time   NA 137 04/05/2023 0459   K 4.4 04/05/2023 0459   CL 109 04/05/2023 0459   CO2 24 04/05/2023 0459   GLUCOSE 88 04/05/2023 0459   BUN 34 (H) 04/05/2023 0459   CREATININE 0.79 04/05/2023 0459   CALCIUM  8.4 (L) 04/05/2023 0459   PROT 5.9 (L) 03/27/2023 0512   ALBUMIN  1.8 (L) 03/31/2023 0523   AST 63 (H) 03/27/2023 0512   ALT 32 03/27/2023 0512   ALKPHOS 65 03/27/2023 0512   BILITOT 1.0 03/27/2023 0512   GFRNONAA >60 04/05/2023 0459     No results found.     Assessment & Plan:   1. Stenosis of carotid artery, unspecified laterality (Primary) The patient is seen for follow up evaluation of carotid stenosis. The carotid stenosis followed by ultrasound.   The patient denies amaurosis fugax. There is no recent history of TIA symptoms or focal motor deficits. There is no prior documented CVA.  The patient is taking enteric-coated aspirin  81 mg daily.  There is no history of migraine headaches. There is no history of seizures.  No recent shortening of the patient's walking distance or new symptoms consistent with claudication.  No history of rest pain symptoms. No new ulcers or wounds of the lower extremities have occurred.  There is no history of DVT, PE or superficial thrombophlebitis. No documented recent episodes of angina or shortness of breath documented.   Carotid Duplex done today shows no change today.  No change compared to last study in 2023 - US  Carotid Duplex Bilateral; Future  2. Essential hypertension, benign Continue antihypertensive medications as already ordered, these medications have been reviewed and there are no changes at this time.  3. Hyperlipidemia, unspecified hyperlipidemia type Continue statin as ordered and reviewed, no changes at this time   Current  Outpatient Medications on File Prior to Visit  Medication Sig Dispense Refill   acetaminophen  (TYLENOL ) 500 MG tablet Take 1,000 mg by mouth in the morning.     allopurinol  (ZYLOPRIM ) 100 MG tablet Take 100 mg by mouth daily.  3   amLODipine  (NORVASC ) 10 MG tablet Take 10 mg by mouth daily.      aspirin  EC 81 MG tablet Take 81 mg by mouth daily. Swallow whole.     atorvastatin  (LIPITOR) 10 MG tablet Take 1 tablet by mouth daily.     desonide  (DESOWEN ) 0.05 % cream Apply 1 Application topically 2 (two) times daily.     docusate sodium  (COLACE) 100 MG capsule Take 1 capsule (100 mg total) by mouth 2 (two) times daily as needed for mild constipation. 10 capsule 0   famotidine  (PEPCID ) 20 MG tablet Take 20 mg by mouth at bedtime.      hydrocortisone  (ANUSOL -HC) 2.5 % rectal cream Apply topically 3 (three) times daily.     ketoconazole  (NIZORAL ) 2 % cream Apply 1 Application topically daily. Apply to feet     loratadine  (CLARITIN ) 10 MG tablet Take 10 mg by mouth daily.      Multiple Vitamins-Minerals (MULTIVITAMIN WITH MINERALS) tablet Take 1 tablet by mouth daily.      tamsulosin  (FLOMAX ) 0.4 MG CAPS capsule Take 0.4 mg by mouth daily after supper.   11   thiamine  (VITAMIN B-1) 100 MG tablet Take 1 tablet (100 mg total) by mouth daily. 30 tablet 0   folic acid  (FOLVITE ) 1 MG tablet Take 1 tablet (1 mg total) by mouth daily. (Patient not taking: Reported on 06/16/2023)  30 tablet 0   pantoprazole  (PROTONIX ) 40 MG tablet Take 1 tablet (40 mg total) by mouth daily at 8 pm. (Patient not taking: Reported on 06/16/2023) 30 tablet 0   No current facility-administered medications on file prior to visit.    There are no Patient Instructions on file for this visit. No follow-ups on file.   Annamaria Barrette, NP

## 2023-06-27 ENCOUNTER — Encounter (INDEPENDENT_AMBULATORY_CARE_PROVIDER_SITE_OTHER): Payer: Self-pay

## 2023-10-25 ENCOUNTER — Encounter: Payer: Self-pay | Admitting: Dermatology

## 2023-10-25 ENCOUNTER — Ambulatory Visit: Payer: Medicare PPO | Admitting: Dermatology

## 2023-10-25 DIAGNOSIS — L578 Other skin changes due to chronic exposure to nonionizing radiation: Secondary | ICD-10-CM

## 2023-10-25 DIAGNOSIS — L820 Inflamed seborrheic keratosis: Secondary | ICD-10-CM | POA: Diagnosis not present

## 2023-10-25 DIAGNOSIS — I872 Venous insufficiency (chronic) (peripheral): Secondary | ICD-10-CM

## 2023-10-25 DIAGNOSIS — L570 Actinic keratosis: Secondary | ICD-10-CM | POA: Diagnosis not present

## 2023-10-25 DIAGNOSIS — L720 Epidermal cyst: Secondary | ICD-10-CM

## 2023-10-25 DIAGNOSIS — Z1283 Encounter for screening for malignant neoplasm of skin: Secondary | ICD-10-CM

## 2023-10-25 DIAGNOSIS — L814 Other melanin hyperpigmentation: Secondary | ICD-10-CM

## 2023-10-25 DIAGNOSIS — L729 Follicular cyst of the skin and subcutaneous tissue, unspecified: Secondary | ICD-10-CM

## 2023-10-25 DIAGNOSIS — W908XXA Exposure to other nonionizing radiation, initial encounter: Secondary | ICD-10-CM | POA: Diagnosis not present

## 2023-10-25 DIAGNOSIS — L719 Rosacea, unspecified: Secondary | ICD-10-CM | POA: Diagnosis not present

## 2023-10-25 DIAGNOSIS — Z79899 Other long term (current) drug therapy: Secondary | ICD-10-CM

## 2023-10-25 DIAGNOSIS — D692 Other nonthrombocytopenic purpura: Secondary | ICD-10-CM

## 2023-10-25 DIAGNOSIS — B353 Tinea pedis: Secondary | ICD-10-CM

## 2023-10-25 DIAGNOSIS — R238 Other skin changes: Secondary | ICD-10-CM

## 2023-10-25 DIAGNOSIS — C84A0 Cutaneous T-cell lymphoma, unspecified, unspecified site: Secondary | ICD-10-CM

## 2023-10-25 DIAGNOSIS — D229 Melanocytic nevi, unspecified: Secondary | ICD-10-CM

## 2023-10-25 DIAGNOSIS — Z7189 Other specified counseling: Secondary | ICD-10-CM

## 2023-10-25 DIAGNOSIS — C84A5 Cutaneous T-cell lymphoma, unspecified, lymph nodes of inguinal region and lower limb: Secondary | ICD-10-CM

## 2023-10-25 MED ORDER — DESONIDE 0.05 % EX CREA: TOPICAL_CREAM | CUTANEOUS | 11 refills | Status: AC

## 2023-10-25 MED ORDER — KETOCONAZOLE 2 % EX CREA: TOPICAL_CREAM | CUTANEOUS | 11 refills | Status: AC

## 2023-10-25 NOTE — Progress Notes (Signed)
 Follow-Up Visit   Subjective  John Peterson is a 78 y.o. male who presents for the following: Skin Cancer Screening and Full Body Skin Exam. Hx of dysplastic nevi. Hx of AKs. Has CTCL.   C/O ISKs at neck he would like treated today.   The patient presents for Total-Body Skin Exam (TBSE) for skin cancer screening and mole check. The patient has spots, moles and lesions to be evaluated, some may be new or changing and the patient may have concern these could be cancer.  The following portions of the chart were reviewed this encounter and updated as appropriate: medications, allergies, medical history  Review of Systems:  No other skin or systemic complaints except as noted in HPI or Assessment and Plan.  Objective  Well appearing patient in no apparent distress; mood and affect are within normal limits.  A full examination was performed including scalp, head, eyes, ears, nose, lips, neck, chest, axillae, abdomen, back, buttocks, bilateral upper extremities, bilateral lower extremities, hands, feet, fingers, toes, fingernails, and toenails. All findings within normal limits unless otherwise noted below.   Relevant physical exam findings are noted in the Assessment and Plan.  face and neck x24 (24) Erythematous keratotic or waxy stuck-on papule or plaque. Scalp x6 (6) Erythematous thin papules/macules with gritty scale.   Assessment & Plan   SKIN CANCER SCREENING PERFORMED TODAY.  HISTORY OF DYSPLASTIC NEVI No evidence of recurrence today Recommend regular full body skin exams Recommend daily broad spectrum sunscreen SPF 30+ to sun-exposed areas, reapply every 2 hours as needed.  Call if any new or changing lesions are noted between office visits  ACTINIC DAMAGE - Chronic condition, secondary to cumulative UV/sun exposure - diffuse scaly erythematous macules with underlying dyspigmentation - Recommend daily broad spectrum sunscreen SPF 30+ to sun-exposed areas, reapply every 2  hours as needed.  - Staying in the shade or wearing long sleeves, sun glasses (UVA+UVB protection) and wide brim hats (4-inch brim around the entire circumference of the hat) are also recommended for sun protection.  - Call for new or changing lesions.  LENTIGINES, SEBORRHEIC KERATOSES, HEMANGIOMAS - Benign normal skin lesions - Benign-appearing - Call for any changes  MELANOCYTIC NEVI - Tan-brown and/or pink-flesh-colored symmetric macules and papules - Benign appearing on exam today - Observation - Call clinic for new or changing moles - Recommend daily use of broad spectrum spf 30+ sunscreen to sun-exposed areas.   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 3 cm pink patch at right buttock  1% BSA Chronic and persistent condition with duration or expected duration over one year. Condition is symptomatic / bothersome to patient. Not to goal, but much improved and under good control. Continue Desonide  to aa's QD PRN flares. Long term medication management.  Patient is using long term (months to years) prescription medication  to control their dermatologic condition.  These medications require periodic monitoring to evaluate for efficacy and side effects and may require periodic laboratory monitoring.   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 Condition is symptomatic/ bothersome to patient. Not currently at goal. Rosacea is a chronic progressive skin condition usually affecting the face of adults, causing redness and/or acne bumps. It is treatable but not curable. It sometimes affects the eyes (ocular rosacea) as well. It may respond to topical and/or systemic medication and can flare with stress, sun exposure, alcohol, exercise, topical steroids (including hydrocortisone /cortisone 10)  and some foods.  Daily application of broad spectrum spf 30+ sunscreen to face is  recommended to reduce flares.  Patient denies grittiness of the eyes  Treatment Plan Counseling for BBL / IPL / Laser and Coordination of Care Discussed the treatment option of Broad Band Light (BBL) /Intense Pulsed Light (IPL)/ Laser for skin discoloration, including brown spots and redness.  Typically we recommend at least 1-3 treatment sessions about 5-8 weeks apart for best results.  Cannot have tanned skin when BBL performed, and regular use of sunscreen/photoprotection is advised after the procedure to help maintain results. The patient's condition may also require maintenance treatments in the future.  The fee for BBL / laser treatments is $350 per treatment session for the whole face.  A fee can be quoted for other parts of the body.  Insurance typically does not pay for BBL/laser treatments and therefore the fee is an out-of-pocket cost. Recommend prophylactic valtrex treatment. Once scheduled for procedure, will send Rx in prior to patient's appointment.    Milia - tiny firm white papule at left cheek - type of cyst - benign - sometimes these will clear with nightly OTC adapalene/Differin 0.1% gel or retinol. - may be extracted if symptomatic - observe  Purpura - Chronic; persistent and recurrent.  Treatable, but not curable. - Violaceous macules and patches at arms - Benign - Related to trauma, age, sun damage and/or use of blood thinners, chronic use of topical and/or oral steroids - Observe - Can use OTC arnica containing moisturizer such as Dermend Bruise Formula if desired - Call for worsening or other concerns  STASIS DERMATITIS with Schamberg' s purpura Exam: Erythematous, scaly patches involving the ankle and distal lower leg with associated lower leg edema. Chronic and persistent condition with duration or expected duration over one year. Condition is bothersome/symptomatic for patient. Currently flared. Stasis in the legs causes chronic leg swelling, which may result  in itchy or painful rashes, skin discoloration, skin texture changes, and sometimes ulceration.  Recommend daily graduated compression hose/stockings- easiest to put on first thing in morning, remove at bedtime.  Elevate legs as much as possible. Avoid salt/sodium rich foods. Treatment Plan: Continue compression stockings as directed by PCP.   TINEA PEDIS Exam: Scaling and maceration web spaces and over distal and lateral soles. Chronic and persistent condition with duration or expected duration over one year. Condition is symptomatic / bothersome to patient. Not to goal. Treatment Plan: Continue Ketoconazole  2% cream as directed to feet.    VENOUS LAKE Exam: red or purple papule at right ear Treatment Plan: Benign-appearing. Observe    INFLAMED SEBORRHEIC KERATOSIS (24) face and neck x24 (24) Symptomatic, irritating, patient would like treated. Destruction of lesion - face and neck x24 (24) Complexity: simple   Destruction method: cryotherapy   Informed consent: discussed and consent obtained   Timeout:  patient name, date of birth, surgical site, and procedure verified Lesion destroyed using liquid nitrogen: Yes   Region frozen until ice ball extended beyond lesion: Yes   Outcome: patient tolerated procedure well with no complications   Post-procedure details: wound care instructions given    AK (ACTINIC KERATOSIS) (6) Scalp x6 (6) Actinic keratoses are precancerous spots that appear secondary to cumulative UV radiation exposure/sun exposure over time. They are chronic with expected duration over 1 year. A portion of actinic keratoses will progress to squamous cell carcinoma of the skin. It is not possible to reliably predict which spots will progress to skin cancer and  so treatment is recommended to prevent development of skin cancer.  Recommend daily broad spectrum sunscreen SPF 30+ to sun-exposed areas, reapply every 2 hours as needed.  Recommend staying in the shade or  wearing long sleeves, sun glasses (UVA+UVB protection) and wide brim hats (4-inch brim around the entire circumference of the hat). Call for new or changing lesions. Destruction of lesion - Scalp x6 (6) Complexity: simple   Destruction method: cryotherapy   Informed consent: discussed and consent obtained   Timeout:  patient name, date of birth, surgical site, and procedure verified Lesion destroyed using liquid nitrogen: Yes   Region frozen until ice ball extended beyond lesion: Yes   Outcome: patient tolerated procedure well with no complications   Post-procedure details: wound care instructions given   Additional details:  Prior to procedure, discussed risks of blister formation, small wound, skin dyspigmentation, or rare scar following cryotherapy. Recommend Vaseline ointment to treated areas while healing.   Return in about 1 year (around 10/24/2024) for TBSE, HxDN, HxCTCL.  I, Jill Parcell, CMA, am acting as scribe for Alm Rhyme, MD.   Documentation: I have reviewed the above documentation for accuracy and completeness, and I agree with the above.  Alm Rhyme, MD

## 2023-10-25 NOTE — Patient Instructions (Addendum)
 Cryotherapy Aftercare  Wash gently with soap and water  everyday.   Apply Vaseline Jelly daily until healed.     Melanoma ABCDEs  Melanoma is the most dangerous type of skin cancer, and is the leading cause of death from skin disease.  You are more likely to develop melanoma if you: Have light-colored skin, light-colored eyes, or red or blond hair Spend a lot of time in the sun Tan regularly, either outdoors or in a tanning bed Have had blistering sunburns, especially during childhood Have a close family member who has had a melanoma Have atypical moles or large birthmarks  Early detection of melanoma is key since treatment is typically straightforward and cure rates are extremely high if we catch it early.   The first sign of melanoma is often a change in a mole or a new dark spot.  The ABCDE system is a way of remembering the signs of melanoma.  A for asymmetry:  The two halves do not match. B for border:  The edges of the growth are irregular. C for color:  A mixture of colors are present instead of an even brown color. D for diameter:  Melanomas are usually (but not always) greater than 6mm - the size of a pencil eraser. E for evolution:  The spot keeps changing in size, shape, and color.  Please check your skin once per month between visits. You can use a small mirror in front and a large mirror behind you to keep an eye on the back side or your body.   If you see any new or changing lesions before your next follow-up, please call to schedule a visit.  Please continue daily skin protection including broad spectrum sunscreen SPF 30+ to sun-exposed areas, reapplying every 2 hours as needed when you're outdoors.   Staying in the shade or wearing long sleeves, sun glasses (UVA+UVB protection) and wide brim hats (4-inch brim around the entire circumference of the hat) are also recommended for sun protection.      Due to recent changes in healthcare laws, you may see results of  your pathology and/or laboratory studies on MyChart before the doctors have had a chance to review them. We understand that in some cases there may be results that are confusing or concerning to you. Please understand that not all results are received at the same time and often the doctors may need to interpret multiple results in order to provide you with the best plan of care or course of treatment. Therefore, we ask that you please give us  2 business days to thoroughly review all your results before contacting the office for clarification. Should we see a critical lab result, you will be contacted sooner.   If You Need Anything After Your Visit  If you have any questions or concerns for your doctor, please call our main line at [phone] and press option 4 to reach your doctor's medical assistant. If no one answers, please leave a voicemail as directed and we will return your call as soon as possible. Messages left after 4 pm will be answered the following business day.   You may also send us  a message via MyChart. We typically respond to MyChart messages within 1-2 business days.  For prescription refills, please ask your pharmacy to contact our office. Our fax number is [phone].  If you have an urgent issue when the clinic is closed that cannot wait until the next business day, you can page your doctor at the  number below.    Please note that while we do our best to be available for urgent issues outside of office hours, we are not available 24/7.   If you have an urgent issue and are unable to reach us , you may choose to seek medical care at your doctor's office, retail clinic, urgent care center, or emergency room.  If you have a medical emergency, please immediately call 911 or go to the emergency department.  Pager Numbers  - Dr. Hester: [phone]  - Dr. Jackquline: [phone]  - Dr. Claudene: [phone]   - Dr. Raymund: [phone]  In the event of inclement weather,  please call our main line at [phone] for an update on the status of any delays or closures.  Dermatology Medication Tips: Please keep the boxes that topical medications come in in order to help keep track of the instructions about where and how to use these. Pharmacies typically print the medication instructions only on the boxes and not directly on the medication tubes.   If your medication is too expensive, please contact our office at [phone] option 4 or send us  a message through MyChart.   We are unable to tell what your co-pay for medications will be in advance as this is different depending on your insurance coverage. However, we may be able to find a substitute medication at lower cost or fill out paperwork to get insurance to cover a needed medication.   If a prior authorization is required to get your medication covered by your insurance company, please allow us  1-2 business days to complete this process.  Drug prices often vary depending on where the prescription is filled and some pharmacies may offer cheaper prices.  The website www.goodrx.com contains coupons for medications through different pharmacies. The prices here do not account for what the cost may be with help from insurance (it may be cheaper with your insurance), but the website can give you the price if you did not use any insurance.  - You can print the associated coupon and take it with your prescription to the pharmacy.  - You may also stop by our office during regular business hours and pick up a GoodRx coupon card.  - If you need your prescription sent electronically to a different pharmacy, notify our office through [facility] or by phone at [phone] option 4.     Si Usted Necesita Algo Despus de Su Visita  Tambin puede enviarnos un mensaje a travs de Clinical cytogeneticist. Por lo general respondemos a los mensajes de MyChart en el transcurso de 1 a 2 das hbiles.  Para renovar recetas, por favor  pida a su farmacia que se ponga en contacto con nuestra oficina. Randi lakes de fax es [place] [phone].  Si tiene un asunto urgente cuando la clnica est cerrada y que no puede esperar hasta el siguiente da hbil, puede llamar/localizar a su doctor(a) al nmero que aparece a continuacin.   Por favor, tenga en cuenta que aunque hacemos todo lo posible para estar disponibles para asuntos urgentes fuera del horario de [place], no estamos disponibles las 24 horas del da, los 7 [address]  [address] de la [place].   Si tiene un problema urgente y no puede comunicarse con nosotros, puede optar por buscar atencin mdica  en el consultorio de su doctor(a), en una clnica privada, en un centro de atencin urgente o en una sala de emergencias.  Si tiene Engineer, drilling, por favor llame inmediatamente al 911 o vaya a la  sala de emergencias.  Nmeros de bper  - Dr. Hester: [phone]  - Dra. Jackquline: [phone]  - Dr. Claudene: [phone]  - Dra. Kitts: [phone]  En caso de inclemencias del [place], por favor llame a nuestra lnea principal al [phone] para una actualizacin sobre el estado de cualquier retraso o cierre.  Consejos para la medicacin en dermatologa: Por favor, guarde las cajas en las que vienen los medicamentos de uso tpico para ayudarle a seguir las instrucciones sobre dnde y cmo usarlos. Las farmacias generalmente imprimen las instrucciones del medicamento slo en las cajas y no directamente en los tubos del [place].   Si su medicamento es muy caro, por favor, pngase en contacto con landry rieger llamando al [phone] y presione la opcin 4 o envenos un mensaje a travs de Clinical cytogeneticist.   No podemos decirle cul ser su copago por los medicamentos por adelantado ya que esto es diferente dependiendo de la cobertura de su seguro. Sin embargo, es posible que podamos encontrar un medicamento sustituto a Audiological scientist un formulario para que el seguro cubra el  medicamento que se considera necesario.   Si se requiere una autorizacin previa para que su compaa de seguros malta su medicamento, por favor permtanos de 1 a 2 das hbiles para completar este proceso.  Los precios de los medicamentos varan con frecuencia dependiendo del Environmental consultant de dnde se surte la receta y alguna farmacias pueden ofrecer precios ms baratos.  El sitio web www.goodrx.com tiene cupones para medicamentos de Health and safety inspector. Los precios aqu no tienen en cuenta lo que podra costar con la ayuda del seguro (puede ser ms barato con su seguro), pero el sitio web puede darle el precio si no utiliz Tourist information centre manager.  - Puede imprimir el cupn correspondiente y llevarlo con su receta a la farmacia.  - Tambin puede pasar por nuestra oficina durante el horario de atencin regular y Education officer, museum una tarjeta de cupones de GoodRx.  - Si necesita que su receta se enve electrnicamente a una farmacia diferente, informe a nuestra oficina a travs de MyChart de [place] o por telfono llamando al [phone] y presione la opcin 4.

## 2024-10-16 ENCOUNTER — Ambulatory Visit: Admitting: Dermatology
